# Patient Record
Sex: Female | Born: 1955 | Race: White | Hispanic: No | Marital: Married | Smoking: Former smoker
Health system: Southern US, Community
[De-identification: ages and names within clinical notes are randomized; demographics above are authoritative.]

## PROBLEM LIST (undated history)

## (undated) DIAGNOSIS — D250 Submucous leiomyoma of uterus: Secondary | ICD-10-CM

## (undated) DIAGNOSIS — L718 Other rosacea: Secondary | ICD-10-CM

## (undated) DIAGNOSIS — L409 Psoriasis, unspecified: Secondary | ICD-10-CM

## (undated) HISTORY — PX: ABDOMINAL HYSTERECTOMY: SHX81

## (undated) HISTORY — DX: Other rosacea: L71.8

## (undated) HISTORY — DX: Submucous leiomyoma of uterus: D25.0

## (undated) HISTORY — DX: Psoriasis, unspecified: L40.9

---

## 1999-05-21 ENCOUNTER — Other Ambulatory Visit: Admission: RE | Admit: 1999-05-21 | Discharge: 1999-05-21 | Payer: Self-pay | Admitting: Obstetrics and Gynecology

## 2000-11-04 ENCOUNTER — Other Ambulatory Visit: Admission: RE | Admit: 2000-11-04 | Discharge: 2000-11-04 | Payer: Self-pay | Admitting: Obstetrics and Gynecology

## 2003-01-17 ENCOUNTER — Other Ambulatory Visit: Admission: RE | Admit: 2003-01-17 | Discharge: 2003-01-17 | Payer: Self-pay | Admitting: Obstetrics and Gynecology

## 2004-03-15 ENCOUNTER — Other Ambulatory Visit: Admission: RE | Admit: 2004-03-15 | Discharge: 2004-03-15 | Payer: Self-pay | Admitting: Obstetrics and Gynecology

## 2004-05-15 ENCOUNTER — Ambulatory Visit (HOSPITAL_COMMUNITY): Admission: RE | Admit: 2004-05-15 | Discharge: 2004-05-15 | Payer: Self-pay | Admitting: Obstetrics and Gynecology

## 2004-05-15 ENCOUNTER — Encounter (INDEPENDENT_AMBULATORY_CARE_PROVIDER_SITE_OTHER): Payer: Self-pay | Admitting: *Deleted

## 2006-11-17 ENCOUNTER — Ambulatory Visit: Payer: Self-pay | Admitting: Family Medicine

## 2007-03-05 ENCOUNTER — Ambulatory Visit: Payer: Self-pay | Admitting: Family Medicine

## 2008-07-07 ENCOUNTER — Ambulatory Visit (HOSPITAL_COMMUNITY): Admission: RE | Admit: 2008-07-07 | Discharge: 2008-07-07 | Payer: Self-pay | Admitting: Obstetrics and Gynecology

## 2008-07-07 ENCOUNTER — Encounter (INDEPENDENT_AMBULATORY_CARE_PROVIDER_SITE_OTHER): Payer: Self-pay | Admitting: Obstetrics and Gynecology

## 2008-12-21 ENCOUNTER — Encounter (INDEPENDENT_AMBULATORY_CARE_PROVIDER_SITE_OTHER): Payer: Self-pay | Admitting: Obstetrics and Gynecology

## 2008-12-21 ENCOUNTER — Ambulatory Visit (HOSPITAL_COMMUNITY): Admission: RE | Admit: 2008-12-21 | Discharge: 2008-12-22 | Payer: Self-pay | Admitting: Obstetrics and Gynecology

## 2010-05-30 LAB — DIFFERENTIAL
Basophils Absolute: 0.1 10*3/uL (ref 0.0–0.1)
Basophils Relative: 1 % (ref 0–1)
Lymphocytes Relative: 24 % (ref 12–46)
Monocytes Relative: 7 % (ref 3–12)
Neutro Abs: 4.5 10*3/uL (ref 1.7–7.7)
Neutrophils Relative %: 66 % (ref 43–77)

## 2010-05-30 LAB — CBC
MCHC: 34.3 g/dL (ref 30.0–36.0)
MCV: 85.7 fL (ref 78.0–100.0)
RBC: 3.15 MIL/uL — ABNORMAL LOW (ref 3.87–5.11)
RBC: 3.73 MIL/uL — ABNORMAL LOW (ref 3.87–5.11)
WBC: 10.8 10*3/uL — ABNORMAL HIGH (ref 4.0–10.5)

## 2010-06-04 LAB — CBC
HCT: 32.6 % — ABNORMAL LOW (ref 36.0–46.0)
MCV: 85.8 fL (ref 78.0–100.0)
RBC: 3.8 MIL/uL — ABNORMAL LOW (ref 3.87–5.11)
WBC: 8 10*3/uL (ref 4.0–10.5)

## 2010-06-04 LAB — DIFFERENTIAL
Eosinophils Absolute: 0.1 10*3/uL (ref 0.0–0.7)
Eosinophils Relative: 1 % (ref 0–5)
Lymphs Abs: 1.2 10*3/uL (ref 0.7–4.0)
Monocytes Absolute: 0.5 10*3/uL (ref 0.1–1.0)
Monocytes Relative: 7 % (ref 3–12)

## 2010-07-09 NOTE — Op Note (Signed)
Andrea Hopkins, Andrea Hopkins            ACCOUNT NO.:  [id number]   MEDICAL RECORD NO.:  [id number]          PATIENT TYPE:  AMB   LOCATION:  SDC                           FACILITY:  [facility]   PHYSICIAN:  Juluis Mire, M.D.   DATE OF BIRTH:  29-Aug-1955   DATE OF PROCEDURE:  07/07/2008  DATE OF DISCHARGE:  07/07/2008                               OPERATIVE REPORT   PREOPERATIVE DIAGNOSES:  1. Abnormal uterine bleeding.  2. Uterine fibroids.   POSTOPERATIVE DIAGNOSIS:  1. Abnormal uterine bleeding.  2. Uterine fibroids.   PROCEDURE:  Dilatation and curettage.   SURGEON:  Juluis Mire, MD   ANESTHESIA:  General.   ESTIMATED BLOOD LOSS:  Minimal.   PACKS AND DRAINS:  None.   INTRAOPERATIVE BLOOD PLACED:  None.   COMPLICATIONS:  None.   INDICATIONS:  Dictated in history and physical.   PROCEDURE IN DETAIL:  The patient was taken to the OR and placed in  supine position.  After satisfactory level of general endotracheal  anesthesia was obtained, the patient was placed in the dorsal lithotomy  using the Allen stirrups.  Perineum and vagina prepped out with Betadine  and draped sterile field.  A speculum was placed in the vaginal vault.  A large amount of tissue was noted at the cervical opening, this was  removed.  Cervix grasped with a single-tooth tenaculum.  Uterus sounded  approximately to 12 cm, it was enlarged approximately 12 weeks.  We did  curettings and a moderate amount of tissue was obtained and sent for  pathology.  We continued endometrial curettings until minimal tissue was  being obtained.  We explored with the Randall stone forceps, re-  curetted, and we felt that we had completely cleared the intrauterine  cavity.  Bleeding was minimal at this point in time.  At this point  in time, the speculum was then removed along with single-tooth  tenaculum.  The patient was taken out in the dorsal lithotomy position,  once alert and extubated, transferred to recovery room  in good  condition.  Sponge, instrument, and needle count reported as correct by  circulating nurse.      Juluis Mire, M.D.  Electronically Signed     JSM/MEDQ  D:  07/07/2008  T:  07/08/2008  Job:  [id number]

## 2010-07-12 NOTE — Op Note (Signed)
Andrea Hopkins, Andrea Hopkins            ACCOUNT NO.:  [id number]   MEDICAL RECORD NO.:  [id number]          PATIENT TYPE:  AMB   LOCATION:  SDC                           [facility]   PHYSICIAN:  Juluis Mire, M.D.   DATE OF BIRTH:  11-21-1955   DATE OF PROCEDURE:  05/15/2004  DATE OF DISCHARGE:                                 OPERATIVE REPORT   PREOPERATIVE DIAGNOSIS:  Menorrhagia, with endometrial polyps.   PREOPERATIVE DIAGNOSIS:  Menorrhagia, with endometrial polyps.   PROCEDURE:  Paracervical block, cervical dilatation, hysteroscopy, resection  of polyps as well as endometrium, endometrial curettings.   SURGEON:  Juluis Mire, M.D.   ANESTHESIA:  Sedation with paracervical block.   ESTIMATED BLOOD LOSS:  Minimal.   PACKS AND DRAINS:  None.   INTRAOPERATIVE BLOOD REPLACED:  None.   COMPLICATIONS:  None.   INDICATIONS:  Are dictated in the history and physical.   PROCEDURE:  The patient was taken to the operating room and placed in the  supine position.  After light sedation, she was placed in the dorsal  lithotomy position using the Allen stirrups.  The patient was draped into a  sterile field.  Speculum was placed in the vaginal vault.  The cervix and  vagina were cleansed with Betadine.  Paracervical block was done using 1%  Nesacaine.  The cervix was grasped with a single-tooth tenaculum.  Uterus  was posteriorly oriented and sounded to 11 cm.  The cervix was serially  dilated to a size 35 Pratt dilator.  Operative hysteroscope was introduced,  and the intrauterine cavity was distended using sorbitol.  Visualization  revealed multiple thickened endometrium with irregularities but no distinct  polyp.  We therefore resected multiple areas of the endometrium and sent it  for pathological review.  At the end of the procedure, all irregularities of  the endometrium had been resected.  We did endometrial curettings.  Total  deficit was 90 cc.  She had no active bleeding  or signs of perforation.  Hysteroscope single-tooth tenaculum, and speculum were.  The patient was  taken out of the dorsal lithotomy position, and once alert was transferred  to the recovery room in good condition.  Sponge, instrument, and needle  counts were reported as correct by the circulating nurse.      JSM/MEDQ  D:  05/15/2004  T:  05/15/2004  Job:  [id number]

## 2010-07-12 NOTE — H&P (Signed)
NAME:  Andrea Hopkins, VENTURI NO.:  [id number]   MEDICAL RECORD NO.:  [id number]          PATIENT TYPE:  AMB   LOCATION:  SDC                           [facility]   PHYSICIAN:  Juluis Mire, M.D.   DATE OF BIRTH:  [birth year]   DATE OF ADMISSION:  DATE OF DISCHARGE:                                HISTORY & PHYSICAL   A 55 year old gravida 3, para 2-1-2-1, married white female who presents for  hysteroscopic evaluation.  Husband has had a prior vasectomy.   Cycles are regular.  She has six days of flow, one to two days being heavy,  changing pads frequently with clots.  No significant dysmenorrhea.  Saline  infusion ultrasound did reveal several decent-sized uterine polyps.  She  also had multiple small fibroids.  The ovaries are unremarkable.  In view of  the polyps, the patient now presents for hysteroscopic resection for  management of menorrhagia.   In terms of allergies, no known drug allergies.   MEDICATIONS:  None.   PAST MEDICAL HISTORY:  The usual childhood diseases.  No significant  sequelae.  No previous surgical history.   PAST OBSTETRICAL HISTORY:  She had preterm delivery of twins at 26 weeks and  subsequent vaginal delivery of a term infant.   SOCIAL HISTORY:  No tobacco or alcohol use.   REVIEW OF SYSTEMS:  Noncontributory.   FAMILY HISTORY:  Noncontributory.   PHYSICAL EXAMINATION:  VITAL SIGNS:  The patient is afebrile with stable  vital signs.  HEENT:  Patient normocephalic.  Pupils equal, round, and reactive to light  and accommodation.  Extraocular movements were intact.  Sclerae and  conjunctivae clear, oropharynx clear.  NECK:  Without thyromegaly.  BREASTS:  No discrete masses.  CHEST:  Lungs clear.  CARDIAC:  Regular rhythm and rate without murmurs or gallops.  ABDOMEN:  Benign.  There are no masses or organomegaly or tenderness.  PELVIC:  Normal external genitalia.  Vaginal mucosa clear.  Cervix is  unremarkable.  Uterus upper  limits of normal size, irregular.  Adnexa  unremarkable.  EXTREMITIES:  Trace edema.  NEUROLOGIC:  Grossly within normal limits.   IMPRESSION:  1.  Menorrhagia.  2.  Endometrial polyps.  3.  Uterine fibroids.   PLAN:  The patient will undergo a hysteroscopic evaluation and resection of  polyps for evaluation.  Hopefully, this will help her menstrual flow.  The  risks of surgery have been discussed, including the risk of infection; the  risk of vascular injury that could lead to hemorrhage requiring transfusion  with associated risk of AIDS or hepatitis; continued bleeding could require  hysterectomy.  There is a risk of perforation that could lead to injury to  adjacent organs requiring further exploratory surgery.  The risks of deep  venous thrombosis and pulmonary embolus.  There is also a risk of fluid  overload leading to hyponatremia and possible pulmonary edema.  The patient  expressed an understanding of indications and risks.      JSM/MEDQ  D:  05/15/2004  T:  05/15/2004  Job:  [id number]

## 2010-07-12 NOTE — H&P (Signed)
Andrea Hopkins, Andrea Hopkins            ACCOUNT NO.:  [id number]   MEDICAL RECORD NO.:  [id number]          PATIENT TYPE:  AMB   LOCATION:  SDC                           [facility]   PHYSICIAN:  Juluis Mire, M.D.   DATE OF BIRTH:  [birth year]   DATE OF ADMISSION:  07/07/2008  DATE OF DISCHARGE:  07/07/2008                              HISTORY & PHYSICAL   HISTORY OF PRESENT ILLNESS:  The patient 55 year old gravida 3, para 2,  abortus 1 female known uterine fibroids.  She had been having trouble  with continuous bleeding.  We had been trying to control with birth  control pills and Prometrium, had been unsuccessful.  She continues have  heavy bleeding with decrease in hemoglobins.  In view of this, we have  done ultrasound revealed multiple uterine fibroids.  She is going to go  through a D&C to see if we can reduce her menstrual flow at this point  in time.   ALLERGIES:  In terms of allergies.  No known drug allergies.   MEDICATIONS:  Include,  1. Birth control pills.  2. Prometrium.  3. Iron sulfate supplementation.   PAST MEDICAL HISTORY:  Usual childhood diseases.  No significant  sequelae.   PAST SURGERIES:  She had a previous hysteroscopy.  Otherwise, she has  had obstetrical deliveries.   SOCIAL HISTORY:  No tobacco or alcohol use.   FAMILY HISTORY:  Noncontributory.   REVIEW OF SYSTEMS:  Noncontributory.   PHYSICAL EXAMINATION:  VITAL SIGNS:  The the patient is afebrile, stable  vital signs.  HEENT:  The patient is normocephalic.  Pupils equal, round and reactive  to light and accommodation.  Extraocular movements were intact.  Sclerae  and conjunctivae are clear.  Oropharynx clear.  NECK:  Without thyromegaly.  BREASTS:  Not examined.  LUNGS:  Clear.  CARDIOVASCULAR SYSTEM:  Regular rate.  No murmurs or gallops.  ABDOMEN:  Benign.  PELVIC:  Normal external genitalia.  Vaginal mucosa is clear.  Cervix  unremarkable.  Uterus 12 weeks in size.  Adnexa  unremarkable.  EXTREMITIES:  Trace edema.  NEUROLOGIC:  Grossly normal limits.   IMPRESSION:  Menorrhagia in a perimenopausal patient with known uterine  fibroids.   PLAN:  We are going to do D and C to see if can slow down the bleeding.  Hopefully the birth control pills after that will have a chance to work.  The risk of procedure have been discussed including the risk of  infection.  Risk of hemorrhage could require transfusion with risk of  AIDS or hepatitis.  Excessive bleeding could require  hysterectomy.  There is risk of perforation that could lead to injury to  adjacent organs, requiring exploratory surgery, risk of deep venous  thrombosis, and pulmonary embolus.  The patient express understanding of  the indications and risks.      Juluis Mire, M.D.  Electronically Signed     JSM/MEDQ  D:  07/07/2008  T:  07/08/2008  Job:  [id number]

## 2012-11-07 ENCOUNTER — Emergency Department (HOSPITAL_COMMUNITY)
Admission: EM | Admit: 2012-11-07 | Discharge: 2012-11-07 | Disposition: A | Discharge status: Home / Self Care | Payer: BC Managed Care – PPO | Attending: Emergency Medicine | Admitting: Emergency Medicine

## 2012-11-07 ENCOUNTER — Encounter (HOSPITAL_COMMUNITY): Payer: Self-pay | Admitting: Emergency Medicine

## 2012-11-07 ENCOUNTER — Emergency Department (HOSPITAL_COMMUNITY): Payer: BC Managed Care – PPO

## 2012-11-07 DIAGNOSIS — M549 Dorsalgia, unspecified: Secondary | ICD-10-CM | POA: Insufficient documentation

## 2012-11-07 DIAGNOSIS — Z87891 Personal history of nicotine dependence: Secondary | ICD-10-CM | POA: Insufficient documentation

## 2012-11-07 DIAGNOSIS — R079 Chest pain, unspecified: Secondary | ICD-10-CM | POA: Insufficient documentation

## 2012-11-07 LAB — BASIC METABOLIC PANEL
BUN: 16 mg/dL (ref 6–23)
Chloride: 100 mEq/L (ref 96–112)
Creatinine, Ser: 0.61 mg/dL (ref 0.50–1.10)
GFR calc Af Amer: >90 mL/min (ref >90)
GFR calc non Af Amer: >90 mL/min (ref >90)

## 2012-11-07 LAB — CBC
HCT: 39.8 % (ref 36.0–46.0)
MCHC: 33.2 g/dL (ref 30.0–36.0)
MCV: 85.2 fL (ref 78.0–100.0)
RDW: 13.4 % (ref 11.5–15.5)

## 2012-11-07 LAB — POCT I-STAT TROPONIN I

## 2012-11-07 MED ORDER — ASPIRIN 81 MG PO CHEW
324.0000 mg | CHEWABLE_TABLET | Freq: Once | ORAL | Status: AC
  Administered 2012-11-07: 324 mg via ORAL
  Filled 2012-11-07: qty 4

## 2012-11-07 NOTE — ED Notes (Signed)
Only complaining of mid back pain.

## 2012-11-07 NOTE — ED Provider Notes (Signed)
CSN: [id number]     Arrival date & time 11/07/12  0410 History   First MD Initiated Contact with Patient 11/07/12 [phone]     Chief Complaint  Patient presents with  . Chest Pain  . Back Pain   (Consider location/radiation/quality/duration/timing/severity/associated sxs/prior Treatment) HPI Comments: 57 yo wf with cp and back pain.  Onset at 0130 AM.  It awoke pt from sleep. No previous similar pain.  No f/c, recent illnesses.  No meds.  No allergies.  No previous cardiac issues.    Patient is a 57 y.o. female presenting with chest pain and back pain. The history is provided by the patient and the spouse.  Chest Pain Pain location:  Substernal area Pain quality: aching   Pain radiates to the back: yes   Pain severity:  Moderate Duration:  4 hours Timing:  Constant Progression:  Improving Chronicity:  New Context: at rest and trauma   Context: not breathing, no drug use, not eating, no intercourse, not lifting, no movement, not raising an arm and no stress   Relieved by:  Nothing Worsened by:  Nothing tried Associated symptoms: back pain   Associated symptoms: no AICD problem, no altered mental status, no anxiety, no claudication, no cough, no diaphoresis, no dizziness, no dysphagia, no fatigue, no fever, no headache, no heartburn, no lower extremity edema, no nausea, no near-syncope, no palpitations and no shortness of breath   Risk factors: no aortic disease, no birth control, no coronary artery disease, no diabetes mellitus, no Ehlers-Danlos syndrome, no high cholesterol and no hypertension   Back Pain Associated symptoms: chest pain   Associated symptoms: no fever and no headaches     History reviewed. No pertinent past medical history. Past Surgical History  Procedure Laterality Date  . Abdominal hysterectomy     History reviewed. No pertinent family history. History  Substance Use Topics  . Smoking status: Former Games developer  . Smokeless tobacco: Not on file  . Alcohol Use:  Yes   OB History   Grav Para Term Preterm Abortions TAB SAB Ect Mult Living                 Review of Systems  Constitutional: Negative.  Negative for fever, diaphoresis and fatigue.  HENT: Negative.  Negative for trouble swallowing.   Eyes: Negative.   Respiratory: Negative.  Negative for apnea, cough, choking, chest tightness, shortness of breath, wheezing and stridor.   Cardiovascular: Positive for chest pain. Negative for palpitations, claudication, leg swelling and near-syncope.  Gastrointestinal: Negative.  Negative for heartburn and nausea.  Endocrine: Negative.   Genitourinary: Negative.  Negative for difficulty urinating and menstrual problem.  Musculoskeletal: Positive for back pain.  Neurological: Negative for dizziness and headaches.  Hematological: Negative.     Allergies  Review of patient's allergies indicates no known allergies.  Home Medications  No current outpatient prescriptions on file. BP 130/62  Pulse 61  Temp(Src) 98.1 F (36.7 C) (Oral)  Resp 18  SpO2 96% Physical Exam  Constitutional: She is oriented to person, place, and time. She appears well-developed and well-nourished. No distress.  HENT:  Head: Normocephalic and atraumatic.  Eyes: Conjunctivae are normal. Pupils are equal, round, and reactive to light.  Neck: Normal range of motion. Neck supple.  Cardiovascular: Normal rate and regular rhythm.  Exam reveals no gallop and no friction rub.   No murmur heard. Pulmonary/Chest: Effort normal and breath sounds normal.  Abdominal: Soft. Bowel sounds are normal. She exhibits no distension and  no mass. There is no tenderness. There is no rebound and no guarding.  Musculoskeletal: Normal range of motion.  Neurological: She is alert and oriented to person, place, and time. She has normal reflexes.  Skin: Skin is warm and dry. She is not diaphoretic.    ED Course  Procedures (including critical care time) Labs Review Labs Reviewed  BASIC  METABOLIC PANEL - Abnormal; Notable for the following:    Sodium 134 (*)    Potassium 3.3 (*)    Glucose, Bld 111 (*)    All other components within normal limits  CBC  POCT I-STAT TROPONIN I  POCT I-STAT TROPONIN I   Imaging Review Dg Chest 2 View  11/07/2012   *RADIOLOGY REPORT*  Clinical Data: Chest pain, back pain  CHEST - 2 VIEW  Comparison:  None available.  Findings: Cardiac and mediastinal silhouettes are within normal limits.  The lungs are well inflated.  No consolidation, pleural effusion, or pulmonary edema.  There is no pneumothorax.  No acute osseous abnormality.  IMPRESSION:  No acute cardiopulmonary process.   Original Report Authenticated By: Rise Mu, M.D.    Date: 11/07/2012 # 1  Rate: 63  Rhythm: normal sinus rhythm  QRS Axis: normal  Intervals: normal  ST/T Wave abnormalities: normal  Conduction Disutrbances:none  Narrative Interpretation:   Old EKG Reviewed: none available   Date: 11/07/2012 # 2 @ 0412  Rate: 63  Rhythm: normal sinus rhythm  QRS Axis: normal  Intervals: normal  ST/T Wave abnormalities: normal  Conduction Disutrbances:none  Narrative Interpretation:   Old EKG Reviewed: none available    Results for orders placed during the hospital encounter of 11/07/12  CBC      Result Value Range   WBC 9.7  4.0 - 10.5 K/uL   RBC 4.67  3.87 - 5.11 MIL/uL   Hemoglobin 13.2  12.0 - 15.0 g/dL   HCT 16.1  09.6 - 04.5 %   MCV 85.2  78.0 - 100.0 fL   MCH 28.3  26.0 - 34.0 pg   MCHC 33.2  30.0 - 36.0 g/dL   RDW 40.9  81.1 - 91.4 %   Platelets 220  150 - 400 K/uL  BASIC METABOLIC PANEL      Result Value Range   Sodium 134 (*) 135 - 145 mEq/L   Potassium 3.3 (*) 3.5 - 5.1 mEq/L   Chloride 100  96 - 112 mEq/L   CO2 23  19 - 32 mEq/L   Glucose, Bld 111 (*) 70 - 99 mg/dL   BUN 16  6 - 23 mg/dL   Creatinine, Ser 7.82  0.50 - 1.10 mg/dL   Calcium 8.9  8.4 - 95.6 mg/dL   GFR calc non Af Amer >90  >90 mL/min   GFR calc Af Amer >90  >90 mL/min   POCT I-STAT TROPONIN I      Result Value Range   Troponin i, poc 0.00  0.00 - 0.08 ng/mL   Comment 3           POCT I-STAT TROPONIN I      Result Value Range   Troponin i, poc 0.00  0.00 - 0.08 ng/mL   Comment 3              MDM   1. Chest pain    57 year old female presents emergency department with chief complaint of chest pain and back pain.  Patient is otherwise healthy and has no medical problems and takes no  medications. Her evaluation in the emergency department was negative. EKG x2, chest x-ray, troponin x2 negative for acute disease. I discussed obtaining a chest CT to assess for aortic dissection, but I feel this is low likelihood. Patient and her husband opted not to have a CT performed. At this point I feel the patient is stable for discharge. I do not suspect acute coronary syndrome, PE, dissection, pneumothorax, or other emergent etiology of her chest pain. Strict ER return precautions were given. Patient will followup with her primary care provider.  The patient appears reasonably screened and/or stabilized for discharge and I doubt any other medical condition or other [facility] requiring further screening, evaluation, or treatment in the ED at this time prior to discharge.  7:00 AM VSS, pt symptom free, requesting to go home    Darlys Gales, MD 11/07/12 0700

## 2012-11-07 NOTE — ED Notes (Signed)
Believes her cp was r/t eating spicy foods yesterday at [employer].

## 2012-11-07 NOTE — ED Notes (Signed)
Pt. woke up from her sleep this morning with mid chest pressure/tightness and mid back pain , denies SOB or nausea. Slight diaphoresis.

## 2014-04-25 ENCOUNTER — Other Ambulatory Visit: Payer: Self-pay | Admitting: Obstetrics and Gynecology

## 2014-04-27 LAB — CYTOLOGY - PAP

## 2014-04-27 LAB — HM PAP SMEAR: HM Pap smear: NEGATIVE

## 2015-05-04 ENCOUNTER — Other Ambulatory Visit: Payer: Self-pay | Admitting: Obstetrics and Gynecology

## 2015-05-04 DIAGNOSIS — R928 Other abnormal and inconclusive findings on diagnostic imaging of breast: Secondary | ICD-10-CM

## 2015-05-21 ENCOUNTER — Other Ambulatory Visit: Payer: Self-pay | Admitting: Obstetrics and Gynecology

## 2015-05-21 ENCOUNTER — Ambulatory Visit
Admission: RE | Admit: 2015-05-21 | Discharge: 2015-05-21 | Disposition: A | Discharge status: Home / Self Care | Payer: BLUE CROSS/BLUE SHIELD | Source: Ambulatory Visit | Attending: Obstetrics and Gynecology | Admitting: Obstetrics and Gynecology

## 2015-05-21 DIAGNOSIS — R928 Other abnormal and inconclusive findings on diagnostic imaging of breast: Secondary | ICD-10-CM

## 2015-06-06 ENCOUNTER — Telehealth: Payer: Self-pay | Admitting: Family Medicine

## 2015-06-06 NOTE — Telephone Encounter (Signed)
Left message for pt to call. Ok per [employer] to return as a pt.

## 2015-06-11 ENCOUNTER — Encounter: Payer: Self-pay | Admitting: Family Medicine

## 2015-06-11 ENCOUNTER — Ambulatory Visit (INDEPENDENT_AMBULATORY_CARE_PROVIDER_SITE_OTHER): Payer: BLUE CROSS/BLUE SHIELD | Admitting: Family Medicine

## 2015-06-11 VITALS — BP 126/82 | HR 92 | Ht 63.0 in | Wt 193.0 lb

## 2015-06-11 DIAGNOSIS — L409 Psoriasis, unspecified: Secondary | ICD-10-CM | POA: Diagnosis not present

## 2015-06-11 DIAGNOSIS — M25561 Pain in right knee: Secondary | ICD-10-CM

## 2015-06-11 DIAGNOSIS — Z803 Family history of malignant neoplasm of breast: Secondary | ICD-10-CM | POA: Diagnosis not present

## 2015-06-11 NOTE — Progress Notes (Signed)
   Subjective:    Patient ID: Andrea Hopkins, female    DOB: March 29, 1955, 60 y.o.   MRN: [id number]  HPI She is here for consult concerning right knee arthritis. She has had difficulty with this for the last 6 months. No history of injury, popping, locking or grinding. She does have a history of psoriasis and is using a topical preparation. She has concerns over the possibility of having psoriatic arthritis. She does not complain of pain anywhere else other than occasional left knee discomfort.   Review of Systems     Objective:   Physical Exam Exam of her skin on her legs does show a few psoriatic lesions that are slightly erythematous and dry. 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 Exam of her knee shows no effusion. Anterior drawer and McMurray's testing negative. Medial and lateral collateral ligaments intact.       Assessment & Plan:  Right knee pain - Plan: DG Knee Complete 4 Views Right  Psoriasis discussed the knee pain with her in regard to generalized arthritis versus psoriatic arthritis. Explained that since she is not having other joints involved, psoriatic arthritis is a little bit less of a concern. Follow-up after x-rays are back. Also discussed general health care. She usually sees her gynecologist and most of her health care needs have been taken care of by him. She does have a sister with breast cancer and therefore gets yearly mammogram.

## 2015-06-11 NOTE — Patient Instructions (Signed)
Tylenol first and then if that doesn't work either Advil or Aleve for pain relief. You can take 2 Aleve twice per day for 4  Advil 3 times per day.

## 2015-06-14 ENCOUNTER — Ambulatory Visit
Admission: RE | Admit: 2015-06-14 | Discharge: 2015-06-14 | Disposition: A | Discharge status: Home / Self Care | Payer: BLUE CROSS/BLUE SHIELD | Source: Ambulatory Visit | Attending: Family Medicine | Admitting: Family Medicine

## 2015-06-14 ENCOUNTER — Other Ambulatory Visit: Payer: Self-pay | Admitting: *Deleted

## 2015-06-14 DIAGNOSIS — M25561 Pain in right knee: Secondary | ICD-10-CM

## 2015-06-26 ENCOUNTER — Ambulatory Visit (INDEPENDENT_AMBULATORY_CARE_PROVIDER_SITE_OTHER): Payer: BLUE CROSS/BLUE SHIELD | Admitting: Family Medicine

## 2015-06-26 DIAGNOSIS — M25561 Pain in right knee: Secondary | ICD-10-CM

## 2015-06-26 MED ORDER — TRIAMCINOLONE ACETONIDE 40 MG/ML IJ SUSP
40.0000 mg | Freq: Once | INTRAMUSCULAR | Status: AC
  Administered 2015-06-26: 40 mg via INTRAMUSCULAR

## 2015-06-26 MED ORDER — LIDOCAINE HCL (PF) 1 % IJ SOLN
2.0000 mL | Freq: Once | INTRAMUSCULAR | Status: AC
  Administered 2015-06-26: 2 mL via INTRADERMAL

## 2015-06-26 NOTE — Progress Notes (Signed)
   Subjective:    Patient ID: Andrea Hopkins, female    DOB: [birth year], 60 y.o.   MRN: [id number]  HPI She is here for recheck. Recent x-ray did show minimal degenerative changes and she is here for follow-up visit concerning that.   Review of Systems     Objective:   Physical Exam Alert and in no distress. The knee was not examined.       Assessment & Plan:  Right knee pain - Plan: lidocaine (PF) (XYLOCAINE) 1 % injection 2 mL, triamcinolone acetonide (KENALOG-40) injection 40 mg The lateral aspect of the knee was cleaned with Betadine. The joint line was identified. 40 mg of Kenalog and 3 mL of Xylocaine was injected into the joint space without difficulty. Within several minutes she did obtain relief of her knee pain. She will call if she has a reoccurrence of her discomfort.

## 2016-05-01 LAB — HM DEXA SCAN: HM Dexa Scan: NORMAL

## 2016-09-02 ENCOUNTER — Ambulatory Visit (INDEPENDENT_AMBULATORY_CARE_PROVIDER_SITE_OTHER): Payer: BLUE CROSS/BLUE SHIELD | Admitting: Family Medicine

## 2016-09-02 ENCOUNTER — Encounter: Payer: Self-pay | Admitting: Family Medicine

## 2016-09-02 VITALS — BP 110/70 | HR 90 | Wt 196.0 lb

## 2016-09-02 DIAGNOSIS — M25562 Pain in left knee: Secondary | ICD-10-CM | POA: Diagnosis not present

## 2016-09-02 DIAGNOSIS — G8929 Other chronic pain: Secondary | ICD-10-CM

## 2016-09-02 DIAGNOSIS — M25561 Pain in right knee: Secondary | ICD-10-CM

## 2016-09-02 DIAGNOSIS — L409 Psoriasis, unspecified: Secondary | ICD-10-CM | POA: Diagnosis not present

## 2016-09-02 DIAGNOSIS — E669 Obesity, unspecified: Secondary | ICD-10-CM | POA: Diagnosis not present

## 2016-09-02 MED ORDER — TRIAMCINOLONE ACETONIDE 40 MG/ML IJ SUSP
80.0000 mg | Freq: Once | INTRAMUSCULAR | Status: AC
  Administered 2016-09-02: 80 mg via INTRAMUSCULAR

## 2016-09-02 MED ORDER — LIDOCAINE HCL 2 % IJ SOLN
6.0000 mL | Freq: Once | INTRAMUSCULAR | Status: AC
  Administered 2016-09-02: 120 mg via INTRADERMAL

## 2016-09-02 NOTE — Progress Notes (Signed)
   Subjective:    Patient ID: Andrea Hopkins, female    DOB: 09-Jun-1955, 61 y.o.   MRN: [id number]  HPI She is here for evaluation of continued difficulty with knee pain. She did have trouble last year with right knee pain and x-ray showed some mild degenerative changes. She was given a steroid injection which did give her roughly 9 months worth of benefit. She is now having pain in both knees. She also has a history of psoriasis and is concerned about this being psoriatic arthritis. She has no other joints involved. No fever, chills, skin changes other than on her legs.   Review of Systems     Objective:   Physical Exam Alert and in no distress. Exam of both knees shows no effusion. No palpable tenderness. Negative anterior drawer. McMurray's testing negative.       Assessment & Plan:  Chronic pain of both knees  Psoriasis Discussed the diagnosis of psoriasis and possible psoriatic arthritis. Explained that we would need to pursue that further when she is ready but at this point do not feel that is truly the case. Discussed injections and she would definitely like to have them. Both knees were prepped with Betadine laterally. The joint space was identified. 40 mg of Kenalog and 3 mL of Xylocaine was injected into both joints without difficulty. She did obtain relative quick relief of most of her pain. She will keep me informed as to how she is doing. I then discussed diet and exercise and the benefits on her knee pain with her. Encouraged her to increase her physical activity and also work on decreasing carbohydrates. Mention the [employer] and [employer].

## 2016-09-02 NOTE — Addendum Note (Signed)
Addended by: Randel Books on: 09/02/2016 04:52 PM   Modules accepted: Orders

## 2018-02-18 IMAGING — MG MM DIAGNOSTIC UNILATERAL R
3 series · 3 of 3 positions shown · non-contrast
Comparison: 04/30/2015 and earlier

CLINICAL DATA: Patient returns after screening study for evaluation
of right breast calcifications.

EXAM:
DIGITAL DIAGNOSTIC RIGHT MAMMOGRAM WITH CAD

[R ML (1 of 2)]
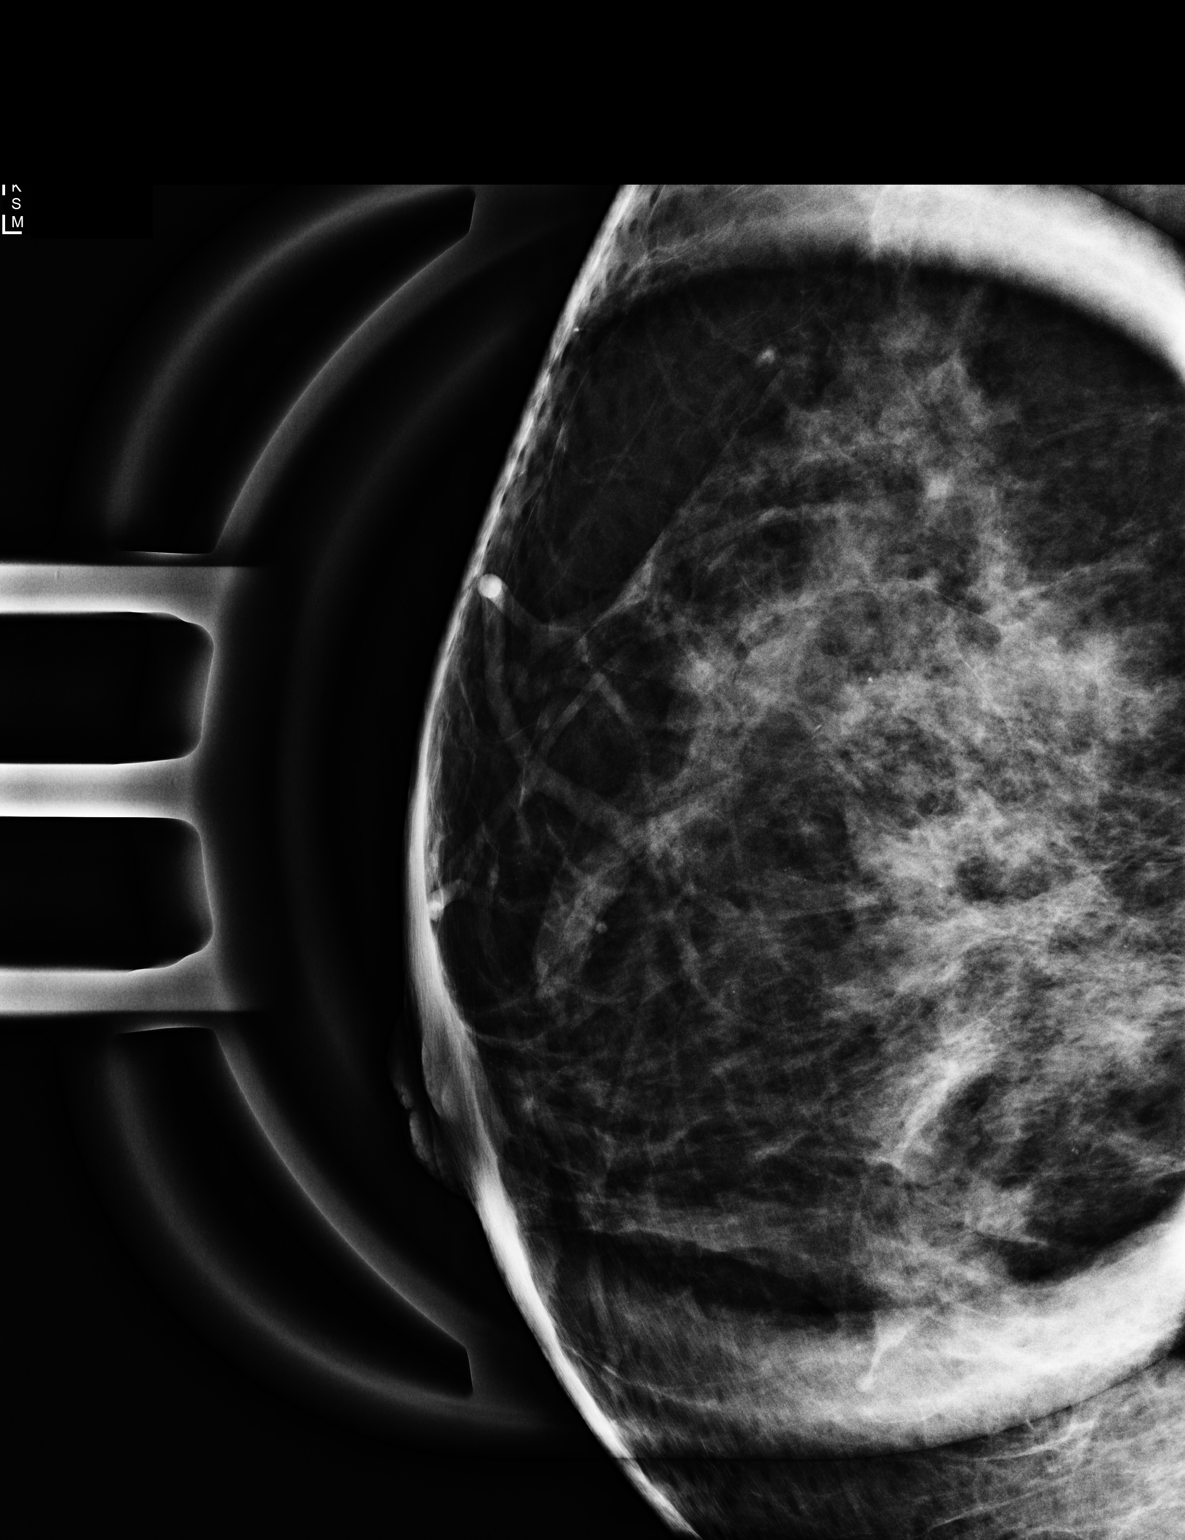

[R CC]
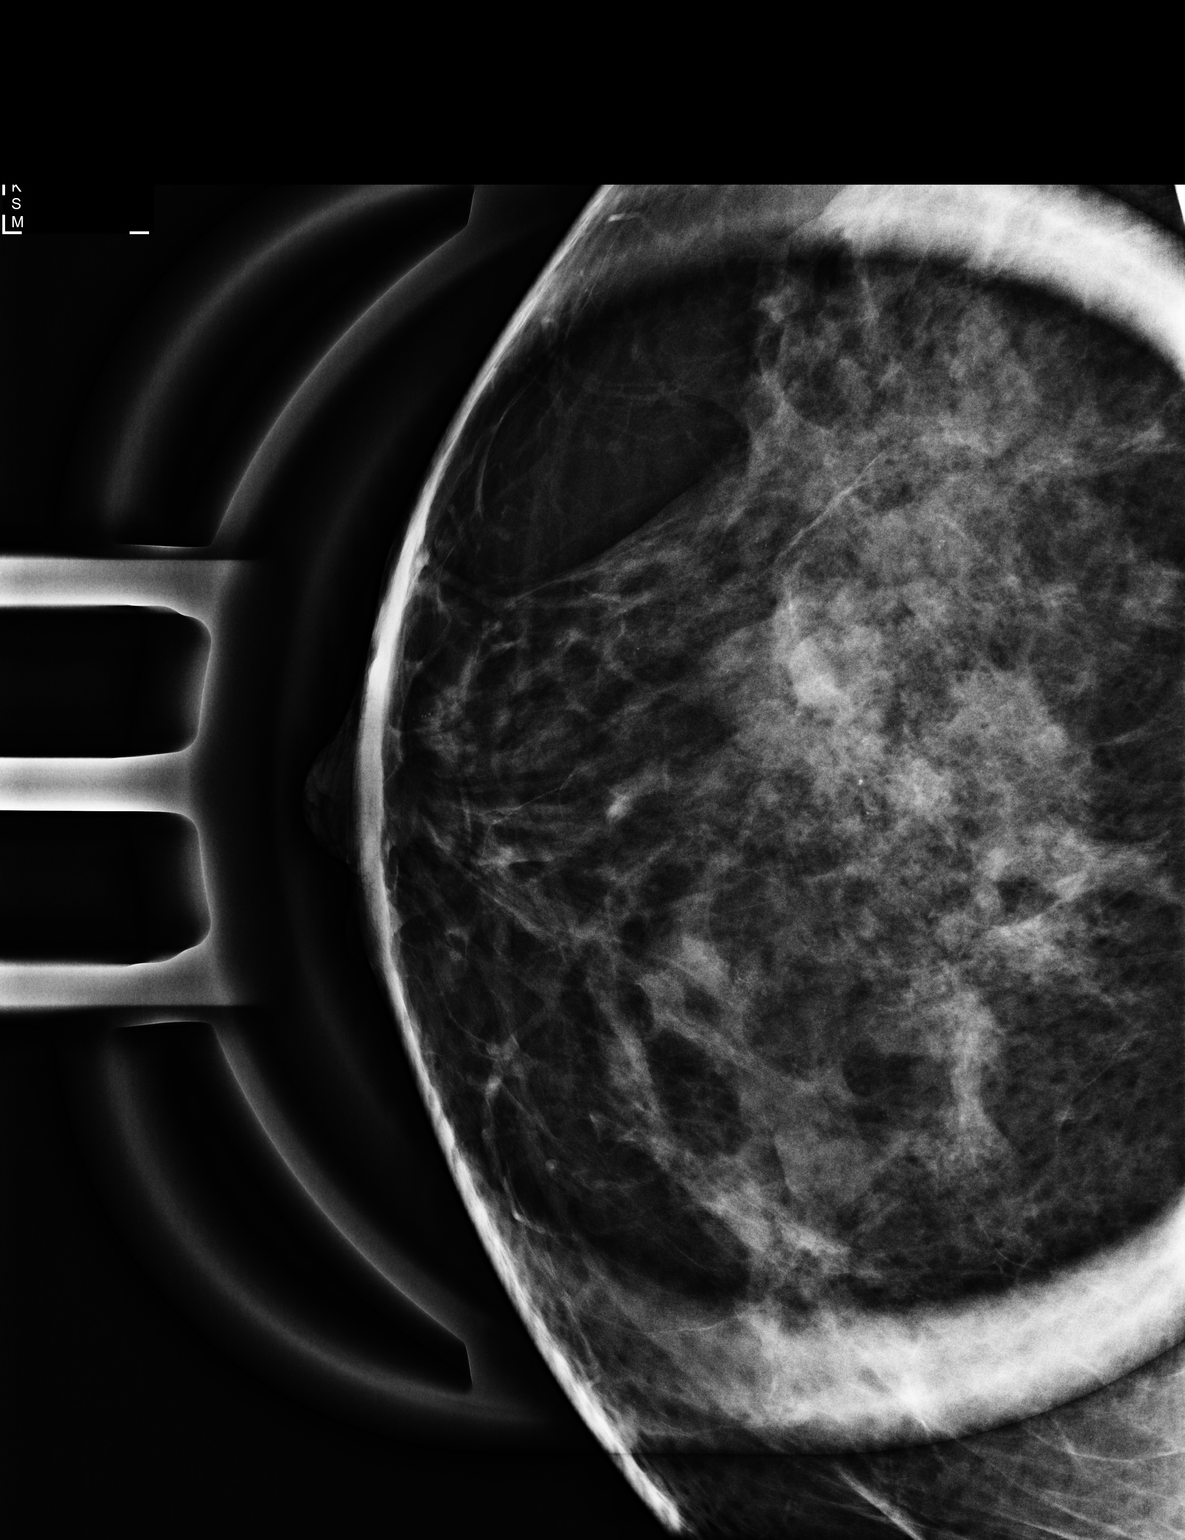

[R ML (2 of 2)]
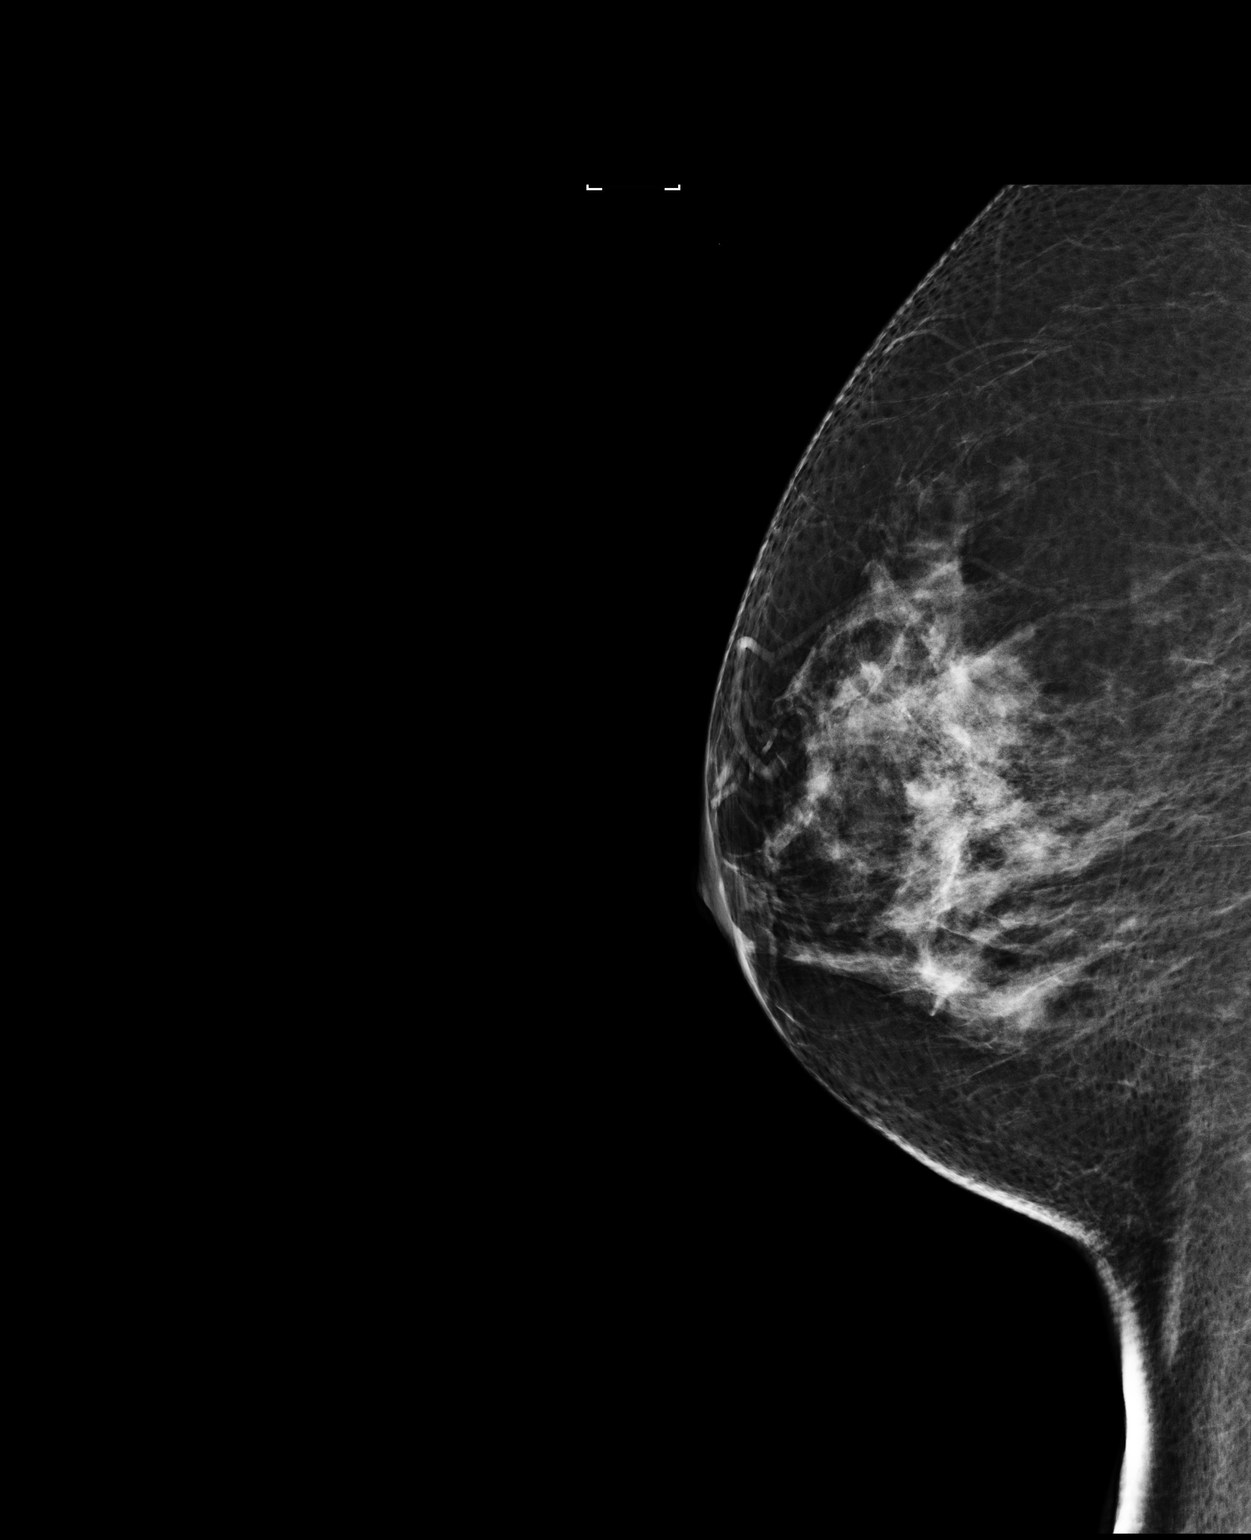

[3 of 3 positions shown; findings below may reference images not displayed]

ACR Breast Density Category c: The breast tissue is heterogeneously
dense, which may obscure small masses.
FINDINGS: Magnified views are performed of calcifications in the right breast,
demonstrating a small group of calcifications, primarily rounded and
layering on true lateral views. Findings are consistent benign milk
of calcium. No suspicious morphology or distribution of
calcifications identified.

Mammographic images were processed with CAD.
IMPRESSION: Benign right breast calcifications.

RECOMMENDATION:
Screening mammogram in one year.(Code:[id number])

I have discussed the findings and recommendations with the patient.
Results were also provided in writing at the conclusion of the
visit. If applicable, a reminder letter will be sent to the patient
regarding the next appointment.

BI-RADS CATEGORY  2: Benign.

## 2018-07-22 ENCOUNTER — Other Ambulatory Visit: Payer: Self-pay | Admitting: Obstetrics and Gynecology

## 2018-07-22 DIAGNOSIS — Z803 Family history of malignant neoplasm of breast: Secondary | ICD-10-CM

## 2018-08-19 ENCOUNTER — Ambulatory Visit
Admission: RE | Admit: 2018-08-19 | Discharge: 2018-08-19 | Disposition: A | Discharge status: Home / Self Care | Payer: BC Managed Care – PPO | Source: Ambulatory Visit | Attending: Obstetrics and Gynecology | Admitting: Obstetrics and Gynecology

## 2018-08-19 DIAGNOSIS — Z803 Family history of malignant neoplasm of breast: Secondary | ICD-10-CM

## 2018-08-19 MED ORDER — GADOBUTROL 1 MMOL/ML IV SOLN
10.0000 mL | Freq: Once | INTRAVENOUS | Status: AC | PRN
  Administered 2018-08-19: 10 mL via INTRAVENOUS

## 2018-10-08 LAB — HM COLONOSCOPY

## 2018-12-16 ENCOUNTER — Other Ambulatory Visit: Payer: Self-pay

## 2018-12-31 ENCOUNTER — Telehealth: Payer: Self-pay | Admitting: Family Medicine

## 2018-12-31 NOTE — Telephone Encounter (Signed)
Received requested info from [facility]

## 2019-01-04 NOTE — Progress Notes (Signed)
ERROR

## 2019-01-19 ENCOUNTER — Encounter: Payer: Self-pay | Admitting: Family Medicine

## 2020-03-29 ENCOUNTER — Other Ambulatory Visit: Payer: Self-pay | Admitting: Obstetrics and Gynecology

## 2020-03-29 DIAGNOSIS — R928 Other abnormal and inconclusive findings on diagnostic imaging of breast: Secondary | ICD-10-CM

## 2020-04-12 ENCOUNTER — Telehealth: Payer: Self-pay

## 2020-04-12 NOTE — Telephone Encounter (Signed)
NOTES ON FILE FROM DR [facility] [phone], SENT REFERRAL TO SCHEDULING

## 2020-04-13 ENCOUNTER — Other Ambulatory Visit: Payer: Self-pay

## 2020-04-13 ENCOUNTER — Ambulatory Visit
Admission: RE | Admit: 2020-04-13 | Discharge: 2020-04-13 | Disposition: A | Discharge status: Home / Self Care | Payer: BC Managed Care – PPO | Source: Ambulatory Visit | Attending: Obstetrics and Gynecology | Admitting: Obstetrics and Gynecology

## 2020-04-13 DIAGNOSIS — R928 Other abnormal and inconclusive findings on diagnostic imaging of breast: Secondary | ICD-10-CM

## 2020-04-13 LAB — HM MAMMOGRAPHY

## 2020-04-24 ENCOUNTER — Encounter: Payer: Self-pay | Admitting: General Practice

## 2020-08-13 ENCOUNTER — Telehealth: Payer: Self-pay

## 2020-08-13 NOTE — Telephone Encounter (Signed)
Pt needs scheduling appt. Progress notes on file sent from: [facility] [place], phone [phone]. A copy of referral order was sent to scheduling.

## 2020-11-08 ENCOUNTER — Other Ambulatory Visit: Payer: Self-pay

## 2020-11-08 ENCOUNTER — Ambulatory Visit (INDEPENDENT_AMBULATORY_CARE_PROVIDER_SITE_OTHER): Payer: BC Managed Care – PPO | Admitting: Cardiology

## 2020-11-08 ENCOUNTER — Encounter: Payer: Self-pay | Admitting: Cardiology

## 2020-11-08 DIAGNOSIS — R7982 Elevated C-reactive protein (CRP): Secondary | ICD-10-CM | POA: Diagnosis not present

## 2020-11-08 NOTE — Patient Instructions (Signed)
Medication Instructions:  No change   Lab Work: None ordered   Testing/Procedures: None ordered   Follow-Up: At [facility], you and your health needs are our priority.  As part of our continuing mission to provide you with exceptional heart care, we have created designated Provider Care Teams.  These Care Teams include your primary Cardiologist (physician) and Advanced Practice Providers (APPs -  Physician Assistants and Nurse Practitioners) who all work together to provide you with the care you need, when you need it.  We recommend signing up for the patient portal called "MyChart".  Sign up information is provided on this After Visit Summary.  MyChart is used to connect with patients for Virtual Visits (Telemedicine).  Patients are able to view lab/test results, encounter notes, upcoming appointments, etc.  Non-urgent messages can be sent to your provider as well.   To learn more about what you can do with MyChart, go to [email or web address].       Your next appointment:   2 years         The format for your next appointment: Office   Provider:  Dr.Harding

## 2020-11-08 NOTE — Progress Notes (Signed)
Primary Care Provider: Denita Lung, MD Gynecologist: Perfecto Kingdom, MD ([facility]) Cardiologist: None Electrophysiologist: None  Clinic Note: Chief Complaint  Patient presents with   New Patient (Initial Visit)    Patient is not 100% sure but thinks that there was confusion that she may have a family history of premature CAD that is incorrect.   Abnormal Lab    Elevated CRP level (note not available at time of clinic visit.     ===================================  ASSESSMENT/PLAN   Problem List Items Addressed This Visit     Elevated C-reactive protein (CRP)    I guess this is the main reason why she was referred.  Unfortunately I was not aware of this when I saw her.  Her lipid panel looks pretty well controlled and she is not having any symptoms.  For risk stratification, recommendation would be coronary calcium score.  Based on this we can determine if additional therapy would be required.  She is already taken aspirin which may or may not have benefit.  She does not have hypertension at baseline, and lipids are relatively well controlled.  Glucose level was normal.  Her major risk factor would be obesity which could also lead to elevated CRP levels.  When she left, the plan was for her to return in 2 years for follow-up.  This was before getting the clinic note from Dr. Radene Knee about CRP level. -->  We will contact her to see if she would be interested in checking a Coronary Calcium Score for risk stratification.      ===================================  HPI:    Andrea Hopkins is a 65 y.o. female with a PMH below who is being seen today for the evaluation of ELEVATED C-REACTIVE PROTEIN (CRP -10.69) with at the request of Arvella Nigh, MD.  --> Unfortunately, at the time of the original visit, I did not have any paperwork from the referring physician.  Andrea Hopkins was referred by her gynecologist for abnormal CRP levels and  at the time what was considered family history of premature CAD. After seeing Dr. Ophelia Charter, Andrea Hopkins conferred with her mother about her father's history.  As it turns out, he never had a heart attack.  He had what was more little than likely and panic attack at age 52.  He was never diagnosed with a heart attack and never had any evaluation. -> I was unaware of the CRP level at the time of the evaluation.  Recent Hospitalizations: None  Reviewed  CV studies:    The following studies were reviewed today: (if available, images/films reviewed: From Epic Chart or Care Everywhere) None:  Interval History:   Andrea Hopkins presents here today for evaluation and she is not really sure.  She did not know anything about CRP level, she knew about her lipid panel and was not aware that it was all that bad.  She does recall the confusion about her father's history.  She wanted to clarify that he did not have a heart attack, and there is no family history of CAD.  She is moderately obese and not very active and therefore she has some exertional dyspnea from deconditioning.  She does not exercise very often.  She would like to, but "never gets around to".  She denies any chest pain or pressure with rest or exertion.  No resting dyspnea, PND, orthopnea or edema.  Pretty much stable from a cardiac standpoint.  CV Review of Symptoms (  Summary) Cardiovascular ROS: positive for - mild exertional dyspnea probably more related to deconditioning and obesity negative for - chest pain, edema, irregular heartbeat, orthopnea, palpitations, paroxysmal nocturnal dyspnea, rapid heart rate, shortness of breath, or lightheadedness or dizziness, syncope/near syncope or TIA/amaurosis fugax, claudication  REVIEWED OF SYSTEMS   Review of Systems  Constitutional:  Negative for malaise/fatigue and weight loss.  HENT:  Negative for congestion and nosebleeds.   Respiratory:  Negative for cough and shortness of breath.    Gastrointestinal:  Negative for blood in stool and melena.  Genitourinary:  Negative for hematuria.  Musculoskeletal:  Positive for joint pain (Joint started hurting her a little bit). Negative for myalgias.  Neurological:  Negative for dizziness, weakness and headaches.  Psychiatric/Behavioral:  Negative for depression. The patient is not nervous/anxious.    I have reviewed and (if needed) personally updated the patient's problem list, medications, allergies, past medical and surgical history, social and family history.   PAST MEDICAL HISTORY   Past Medical History:  Diagnosis Date   Ocular rosacea    Psoriasis    Uterine fibroids    s/p Lap-assisted vaginal hysterectomy    PAST SURGICAL HISTORY   Past Surgical History:  Procedure Laterality Date   ABDOMINAL HYSTERECTOMY      Immunization History  Administered Date(s) Administered   Moderna Sars-Covid-2 Vaccination 03/01/2019, 03/29/2019, 11/29/2019    MEDICATIONS/ALLERGIES   Current Meds  Medication Sig   aspirin 81 MG tablet Take 81 mg by mouth daily.   doxycycline (VIBRA-TABS) 100 MG tablet Take 100 mg by mouth daily.   prednisoLONE acetate (PRED FORTE) 1 % ophthalmic suspension SMARTSIG:In Eye(s)    Allergies  Allergen Reactions   Other     SOCIAL HISTORY/FAMILY HISTORY   Reviewed in Epic:  Pertinent findings:  Social History   Tobacco Use   Smoking status: Former   Smokeless tobacco: Never  Substance Use Topics   Alcohol use: Yes    Alcohol/week: 2.0 standard drinks    Types: 2 Standard drinks or equivalent per week   Drug use: No   Social History   Social History Narrative   Married mother of 2.  (Jessica-DOB 10/13/1980; Stephen-DOB 10/31/1985)   Lives with her husband.      Occasional exercise.  No routine exercise.   .   After seeing Dr. Ophelia Charter, Andrea Hopkins conferred with her mother about her father's history.  As it turns out, he never had a heart attack.  He had what was more little than  likely and panic attack at age 6.  He was never diagnosed with a heart attack and never had any evaluation.    OBJCTIVE -PE, EKG, labs   Wt Readings from Last 3 Encounters:  11/08/20 204 lb (92.5 kg)  09/02/16 196 lb (88.9 kg)  06/11/15 193 lb (87.5 kg)    Physical Exam: BP 130/80 (BP Location: Right Arm)   Pulse 85   Ht '5\' 4"'  (1.626 m)   Wt 204 lb (92.5 kg)   SpO2 99%   BMI 35.02 kg/m  Physical Exam Vitals reviewed.  Constitutional:      General: She is not in acute distress.    Appearance: She is obese. She is not ill-appearing or toxic-appearing.  HENT:     Head: Normocephalic and atraumatic.  Neck:     Vascular: No carotid bruit or JVD.  Cardiovascular:     Rate and Rhythm: Normal rate and regular rhythm. No extrasystoles are present.    Chest Wall:  PMI is not displaced (Very difficult to palpate due to body habitus).     Pulses: Normal pulses.     Heart sounds: S1 normal and S2 normal. Heart sounds are distant. No murmur heard.   No friction rub. No gallop.  Pulmonary:     Effort: Pulmonary effort is normal. No respiratory distress.     Breath sounds: Normal breath sounds. No wheezing, rhonchi or rales.  Chest:     Chest wall: No tenderness.  Musculoskeletal:        General: Swelling (Trivial puffy ankle swelling) present. Normal range of motion.     Cervical back: Normal range of motion and neck supple.  Skin:    General: Skin is warm and dry.  Neurological:     General: No focal deficit present.     Mental Status: She is alert and oriented to person, place, and time.  Psychiatric:        Mood and Affect: Mood normal.        Behavior: Behavior normal.        Thought Content: Thought content normal.        Judgment: Judgment normal.     Adult ECG Report  Rate: 85;  Rhythm: normal sinus rhythm and left atrial abnormality otherwise normal axis, intervals and durations. ;   Narrative Interpretation: Normal EKG.  Recent Labs:   04/04/2020: CRP 10.69; TC 154,  TG 74, HDL 58, LDL 92; TSH 3.02, T4 9.8, T3 uptake 24, free thyroxine index 2.4 (normal); Na+ 140, K+ 4.2, Cl- 102, HCO3-23, BUN 11, Cr 0.67, Glu 90, Ca2+ 9.3; AST 27, ALT 28, AlkP 128 (borderline elevated).  T bili 0.5.  T protein 7.2, Albumin 4.2 05/28/2020: AST/ALT 34/37.  Alk phos 131.  ==================================================  COVID-19 Education: The signs and symptoms of COVID-19 were discussed with the patient and how to seek care for testing (follow up with PCP or arrange E-visit).    I spent a total of 25 minutes with the patient spent in direct patient consultation.  Additional time spent with chart review  / charting (studies, outside notes, etc): 25 min Total Time: 50 min  Current medicines are reviewed at length with the patient today.  (+/- concerns) N/A  This visit occurred during the SARS-CoV-2 public health emergency.  Safety protocols were in place, including screening questions prior to the visit, additional usage of staff PPE, and extensive cleaning of exam room while observing appropriate contact time as indicated for disinfecting solutions.  Notice: This dictation was prepared with Dragon dictation along with smaller phrase technology. Any transcriptional errors that result from this process are unintentional and may not be corrected upon review.  Patient Instructions / Medication Changes & Studies & Tests Ordered   Patient Instructions  Medication Instructions:  No change   Lab Work: None ordered   Testing/Procedures: None ordered   Follow-Up: At [facility], you and your health needs are our priority.  As part of our continuing mission to provide you with exceptional heart care, we have created designated Provider Care Teams.  These Care Teams include your primary Cardiologist (physician) and Advanced Practice Providers (APPs -  Physician Assistants and Nurse Practitioners) who all work together to provide you with the care you need, when you need  it.  We recommend signing up for the patient portal called "MyChart".  Sign up information is provided on this After Visit Summary.  MyChart is used to connect with patients for Virtual Visits (Telemedicine).  Patients are  able to view lab/test results, encounter notes, upcoming appointments, etc.  Non-urgent messages can be sent to your provider as well.   To learn more about what you can do with MyChart, go to [email or web address].       Your next appointment:   2 years         The format for your next appointment: Office   Provider:  Dr.Aysa Larivee     Studies Ordered:   No orders of the defined types were placed in this encounter.    Glenetta Hew, M.D., M.S. Interventional Cardiologist   Pager # [phone] Phone # [phone] [address] [place], [place] [address]   Thank you for choosing Heartcare at [facility]!!

## 2020-11-19 ENCOUNTER — Encounter: Payer: Self-pay | Admitting: Cardiology

## 2020-11-19 DIAGNOSIS — R7982 Elevated C-reactive protein (CRP): Secondary | ICD-10-CM | POA: Insufficient documentation

## 2020-11-19 NOTE — Assessment & Plan Note (Signed)
I guess this is the main reason why she was referred.  Unfortunately I was not aware of this when I saw her.  Her lipid panel looks pretty well controlled and she is not having any symptoms.  For risk stratification, recommendation would be coronary calcium score.  Based on this we can determine if additional therapy would be required.  She is already taken aspirin which may or may not have benefit.  She does not have hypertension at baseline, and lipids are relatively well controlled.  Glucose level was normal.  Her major risk factor would be obesity which could also lead to elevated CRP levels.  When she left, the plan was for her to return in 2 years for follow-up.  This was before getting the clinic note from Dr. Radene Knee about CRP level. -->  We will contact her to see if she would be interested in checking a Coronary Calcium Score for risk stratification.

## 2020-11-21 ENCOUNTER — Telehealth: Payer: Self-pay

## 2020-11-21 NOTE — Telephone Encounter (Signed)
NOTES ON FILE FROM [facility] [phone], SENT REFERRAL TO SCHEDULING

## 2020-11-30 ENCOUNTER — Telehealth: Payer: Self-pay

## 2020-11-30 NOTE — Telephone Encounter (Signed)
NOTES SCANNED TO REFERRAL 

## 2021-09-26 ENCOUNTER — Other Ambulatory Visit: Payer: Self-pay | Admitting: Obstetrics and Gynecology

## 2021-09-26 DIAGNOSIS — R928 Other abnormal and inconclusive findings on diagnostic imaging of breast: Secondary | ICD-10-CM

## 2021-10-03 ENCOUNTER — Other Ambulatory Visit: Payer: Self-pay | Admitting: Obstetrics and Gynecology

## 2021-10-14 ENCOUNTER — Ambulatory Visit
Admission: RE | Admit: 2021-10-14 | Discharge: 2021-10-14 | Disposition: A | Discharge status: Home / Self Care | Payer: BC Managed Care – PPO | Source: Ambulatory Visit | Attending: Obstetrics and Gynecology | Admitting: Obstetrics and Gynecology

## 2021-10-14 DIAGNOSIS — R928 Other abnormal and inconclusive findings on diagnostic imaging of breast: Secondary | ICD-10-CM

## 2021-10-30 ENCOUNTER — Encounter: Payer: Self-pay | Admitting: Internal Medicine

## 2021-12-03 ENCOUNTER — Encounter: Payer: Self-pay | Admitting: Internal Medicine

## 2021-12-16 ENCOUNTER — Encounter: Payer: Self-pay | Admitting: Internal Medicine

## 2022-11-11 LAB — HM MAMMOGRAPHY

## 2022-11-11 LAB — HM DEXA SCAN: HM Dexa Scan: NORMAL

## 2022-11-12 ENCOUNTER — Encounter: Payer: Self-pay | Admitting: Family Medicine

## 2022-12-25 ENCOUNTER — Encounter: Payer: Self-pay | Admitting: Family Medicine

## 2022-12-25 ENCOUNTER — Ambulatory Visit (INDEPENDENT_AMBULATORY_CARE_PROVIDER_SITE_OTHER): Payer: BLUE CROSS/BLUE SHIELD | Admitting: Family Medicine

## 2022-12-25 VITALS — BP 118/78 | HR 82 | Ht 64.0 in | Wt 191.8 lb

## 2022-12-25 DIAGNOSIS — R7982 Elevated C-reactive protein (CRP): Secondary | ICD-10-CM

## 2022-12-25 DIAGNOSIS — Z803 Family history of malignant neoplasm of breast: Secondary | ICD-10-CM | POA: Diagnosis not present

## 2022-12-25 DIAGNOSIS — N814 Uterovaginal prolapse, unspecified: Secondary | ICD-10-CM | POA: Insufficient documentation

## 2022-12-25 DIAGNOSIS — Z90710 Acquired absence of both cervix and uterus: Secondary | ICD-10-CM | POA: Insufficient documentation

## 2022-12-25 DIAGNOSIS — H9313 Tinnitus, bilateral: Secondary | ICD-10-CM | POA: Insufficient documentation

## 2022-12-25 DIAGNOSIS — E66811 Obesity, class 1: Secondary | ICD-10-CM

## 2022-12-25 DIAGNOSIS — Z23 Encounter for immunization: Secondary | ICD-10-CM | POA: Diagnosis not present

## 2022-12-25 DIAGNOSIS — L718 Other rosacea: Secondary | ICD-10-CM | POA: Insufficient documentation

## 2022-12-25 DIAGNOSIS — L409 Psoriasis, unspecified: Secondary | ICD-10-CM | POA: Diagnosis not present

## 2022-12-25 NOTE — Progress Notes (Signed)
Andrea Hopkins is a 67 y.o. female who presents for welcome to Medicare visit and follow-up on chronic medical conditions.  She has had a hysterectomy.  She also has been having difficulty recently with a cystocele with prolapse and is having this monitored at the present time.  She is not interested in any major intervention.  She he is using estrogen cream.  She has also now started noting some ringing in her ears but at this point is not interested in pursuing this any further.  She does have a family history of breast cancer and does get mammograms yearly.  She also sees ophthalmology and has been diagnosed with ocular rosacea.  Her psoriasis is not causing any difficulty.  Immunizations and Health Maintenance Immunization History  Administered Date(s) Administered   Influenza-Unspecified 11/24/2020   Moderna Sars-Covid-2 Vaccination 03/01/2019, 03/29/2019, 11/29/2019   Zoster Recombinant(Shingrix) 03/07/2021   Health Maintenance Due  Topic Date Due   HIV Screening  Never done   Hepatitis C Screening  Never done   DTaP/Tdap/Td (1 - Tdap) Never done   Cervical Cancer Screening (HPV/Pap Cotest)  04/26/2017   Zoster Vaccines- Shingrix (2 of 2) 08/19/2021   Pneumonia Vaccine 63+ Years old (1 of 1 - PCV) Never done    Last Pap smear: March 2016 Last mammogram: September 2024 Last colonoscopy: August 2020 Last DEXA: September 2024 Dentist: Dr. Ninetta Lights Ophtho: Dr. Dione Booze and Dr. Noel Gerold  Exercise: Walking 20 minutes a day at work   Other doctors caring for patient include:  Advanced directives: No living will or power of attorney . Info given    Depression screen:  See questionnaire below.     12/25/2022   10:51 AM  Depression screen PHQ 2/9  Decreased Interest 0  Down, Depressed, Hopeless 0  PHQ - 2 Score 0    Fall Risk Screen: see questionnaire below.    12/25/2022   10:51 AM  Fall Risk   Falls in the past year? 0  Number falls in past yr: 0  Injury with Fall? 0     ADL screen:  See questionnaire below Functional Status Survey: Is the patient deaf or have difficulty hearing?: No Does the patient have difficulty seeing, even when wearing glasses/contacts?: No Does the patient have difficulty concentrating, remembering, or making decisions?: No (only age related changes) Does the patient have difficulty walking or climbing stairs?: No Does the patient have difficulty dressing or bathing?: No Does the patient have difficulty doing errands alone such as visiting a doctor's office or shopping?: No   Review of Systems Constitutional: -, -unexpected weight change, -anorexia, -fatigue Allergy: -sneezing, -itching, -congestion Dermatology: denies changing moles, rash, lumps ENT: -runny nose, -ear pain, -sore throat,  Cardiology:  -chest pain, -palpitations, -orthopnea, Respiratory: -cough, -shortness of breath, -dyspnea on exertion, -wheezing,  Gastroenterology: -abdominal pain, -nausea, -vomiting, -diarrhea, -constipation, -dysphagia Hematology: -bleeding or bruising problems Musculoskeletal: -arthralgias, -myalgias, -joint swelling, -back pain, - Ophthalmology: -vision changes,  Urology: -dysuria, -difficulty urinating,  -urinary frequency, -urgency, incontinence Neurology: -, -numbness, , -memory loss, -falls, -dizziness  She had a EKG in 2022  PHYSICAL EXAM:  BP 118/78   Pulse 82   Ht 5\' 4"  (1.626 m)   Wt 191 lb 12.8 oz (87 kg)   SpO2 95%   BMI 32.92 kg/m   General Appearance: Alert, cooperative, no distress, appears stated age Head: Normocephalic, without obvious abnormality, atraumatic Eyes: PERRL, conjunctiva/corneas clear, EOM's intact,  Ears: Normal TM's and external ear canals Nose: Nares  normal, mucosa normal, no drainage or sinus tenderness Throat: Lips, mucosa, and tongue normal; teeth and gums normal Neck: Supple, no lymphadenopathy;  thyroid:  no enlargement/tenderness/nodules; no carotid bruit or JVD Lungs: Clear to  auscultation bilaterally without wheezes, rales or ronchi; respirations unlabored Heart: Regular rate and rhythm, S1 and S2 normal, no murmur, rubor gallop Abdomen: Soft, non-tender, nondistended, normoactive bowel sounds,  no masses, no hepatosplenomegaly Extremities: No clubbing, cyanosis or edema Skin:  Skin color, texture, turgor normal, no rashes or lesions Lymph nodes: Cervical, supraclavicular, and axillary nodes normal Neurologic:  CNII-XII intact, normal strength, sensation and gait; reflexes 2+ and symmetric throughout Psych: Normal mood, affect, hygiene and grooming.  ASSESSMENT/PLAN: Obesity (BMI 30.0-34.9) - Plan: CBC with Differential/Platelet, Comprehensive metabolic panel, Lipid panel  Family history of breast cancer in sister  Psoriasis  Elevated C-reactive protein (CRP)  Need for vaccination against Streptococcus pneumoniae - Plan: Pneumococcal conjugate vaccine 20-valent (Prevnar 20)  Ocular rosacea  Need for influenza vaccination - Plan: Flu Vaccine Trivalent High Dose (Fluad)  Cystocele with prolapse  History of hysterectomy  Tinnitus of both ears    Discussed monthly self breast exams and yearly mammograms   Immunization recommendations discussed.  Colonoscopy recommendations reviewed   Medicare Attestation I have personally reviewed: The patient's medical and social history Their use of alcohol, tobacco or illicit drugs Their current medications and supplements The patient's functional ability including ADLs,fall risks, home safety risks, cognitive, and hearing and visual impairment Diet and physical activities Evidence for depression or mood disorders  The patient's weight, height, and BMI have been recorded in the chart.  I have made referrals, counseling, and provided education to the patient based on review of the above and I have provided the patient with a written personalized care plan for preventive services.     Sharlot Gowda,  MD   12/25/2022

## 2022-12-26 LAB — COMPREHENSIVE METABOLIC PANEL
ALT: 27 [IU]/L (ref 0–32)
AST: 26 [IU]/L (ref 0–40)
Albumin: 4.1 g/dL (ref 3.9–4.9)
Alkaline Phosphatase: 121 [IU]/L (ref 44–121)
BUN/Creatinine Ratio: 20 (ref 12–28)
BUN: 12 mg/dL (ref 8–27)
Bilirubin Total: 0.5 mg/dL (ref 0.0–1.2)
CO2: 22 mmol/L (ref 20–29)
Calcium: 9.2 mg/dL (ref 8.7–10.3)
Chloride: 104 mmol/L (ref 96–106)
Creatinine, Ser: 0.59 mg/dL (ref 0.57–1.00)
Globulin, Total: 2.7 g/dL (ref 1.5–4.5)
Glucose: 91 mg/dL (ref 70–99)
Potassium: 4.4 mmol/L (ref 3.5–5.2)
Sodium: 140 mmol/L (ref 134–144)
Total Protein: 6.8 g/dL (ref 6.0–8.5)
eGFR: 100 mL/min/{1.73_m2} (ref >59)

## 2022-12-26 LAB — LIPID PANEL
Chol/HDL Ratio: 2.7 ratio (ref 0.0–4.4)
Cholesterol, Total: 166 mg/dL (ref 100–199)
HDL: 61 mg/dL (ref >39)
LDL Chol Calc (NIH): 92 mg/dL (ref 0–99)
Triglycerides: 70 mg/dL (ref 0–149)
VLDL Cholesterol Cal: 13 mg/dL (ref 5–40)

## 2022-12-26 LAB — CBC WITH DIFFERENTIAL/PLATELET
Basophils Absolute: 0.1 10*3/uL (ref 0.0–0.2)
Basos: 1 %
EOS (ABSOLUTE): 0.2 10*3/uL (ref 0.0–0.4)
Eos: 3 %
Hematocrit: 39.6 % (ref 34.0–46.6)
Hemoglobin: 12.8 g/dL (ref 11.1–15.9)
Immature Grans (Abs): 0 10*3/uL (ref 0.0–0.1)
Immature Granulocytes: 1 %
Lymphocytes Absolute: 1.8 10*3/uL (ref 0.7–3.1)
Lymphs: 28 %
MCH: 27.5 pg (ref 26.6–33.0)
MCHC: 32.3 g/dL (ref 31.5–35.7)
MCV: 85 fL (ref 79–97)
Monocytes Absolute: 0.5 10*3/uL (ref 0.1–0.9)
Monocytes: 7 %
Neutrophils Absolute: 3.9 10*3/uL (ref 1.4–7.0)
Neutrophils: 60 %
Platelets: 298 10*3/uL (ref 150–450)
RBC: 4.65 x10E6/uL (ref 3.77–5.28)
RDW: 13.2 % (ref 11.7–15.4)
WBC: 6.5 10*3/uL (ref 3.4–10.8)

## 2023-01-12 IMAGING — MG MM DIGITAL DIAGNOSTIC UNILAT*L* W/ TOMO W/ CAD
4 series · 4 of 12 positions shown · non-contrast
Comparison: Previous exam(s).

CLINICAL DATA: Screening recall for a possible left breast mass.
Patient reports that she has 2 sisters with breast carcinoma.

EXAM:
DIGITAL DIAGNOSTIC UNILATERAL LEFT MAMMOGRAM WITH TOMOSYNTHESIS AND
CAD; ULTRASOUND LEFT BREAST LIMITED
TECHNIQUE: Left digital diagnostic mammography and breast tomosynthesis was
performed. The images were evaluated with computer-aided detection.;
Targeted ultrasound examination of the left breast was performed

[L CC synth-2D]
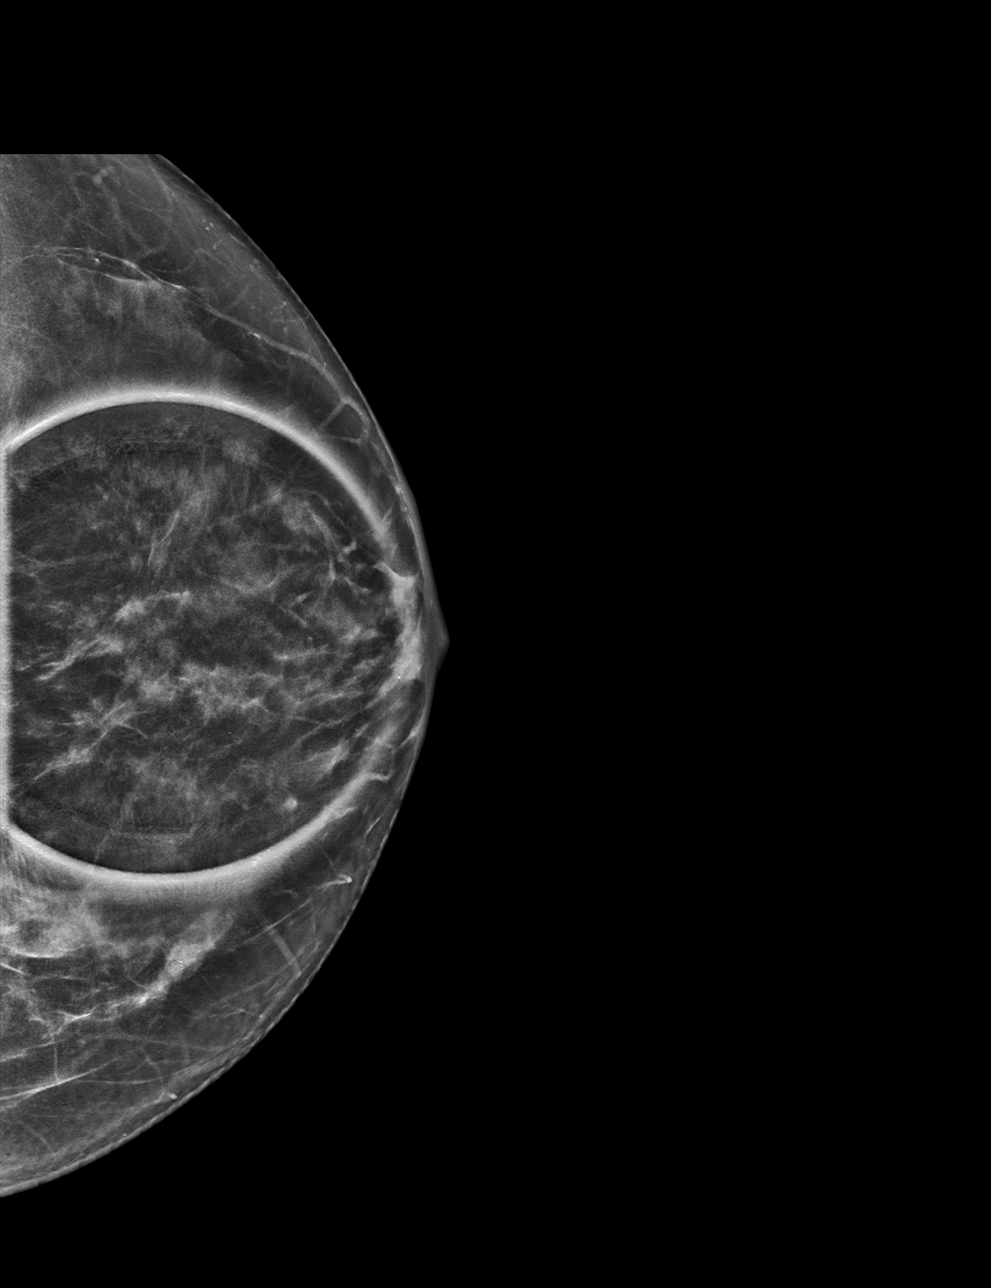

[L MLO synth-2D]
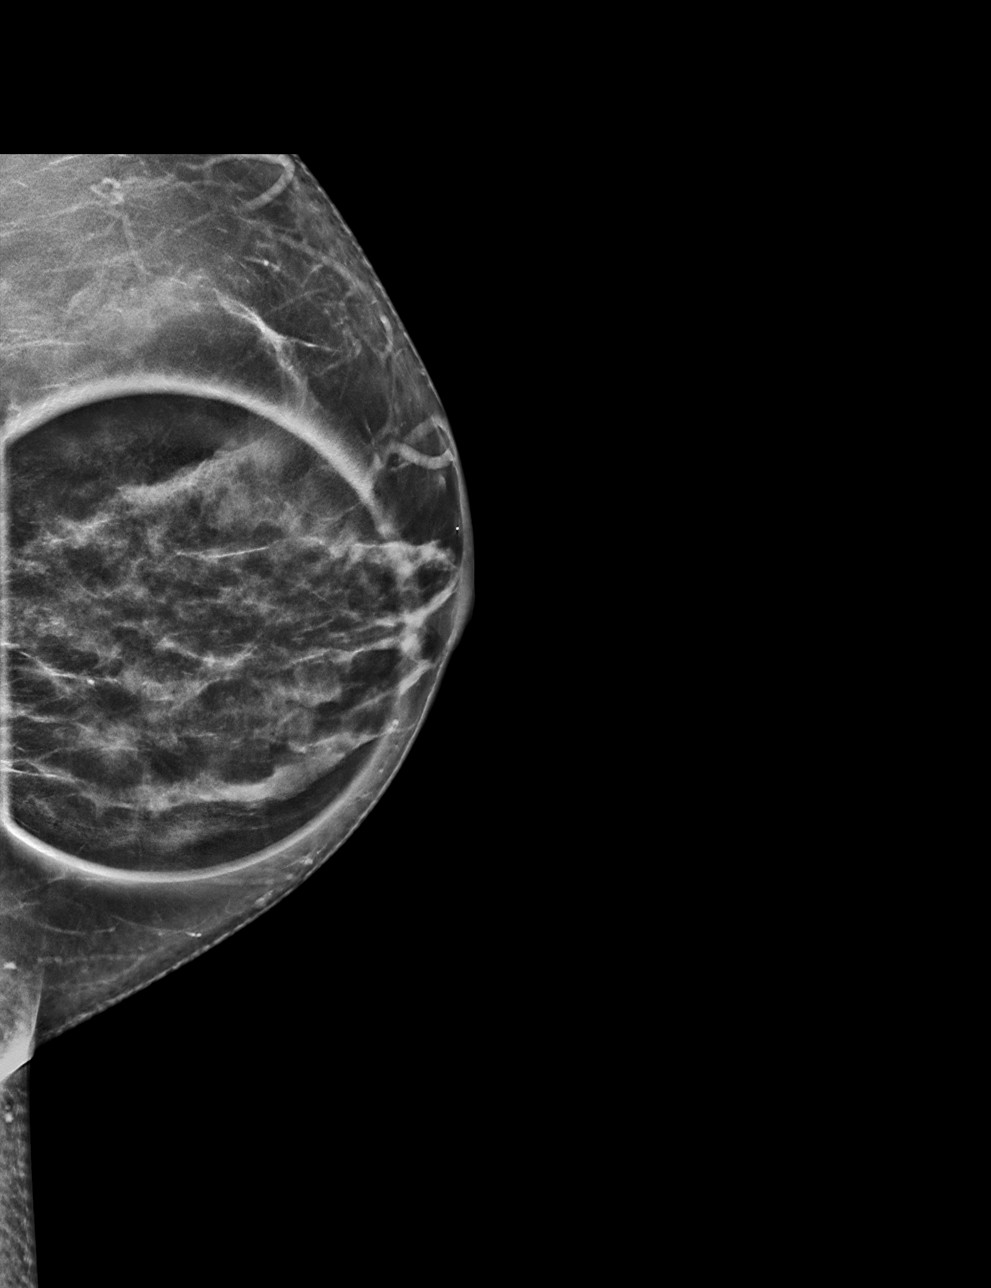

[L CC tomo · tomo slice 29/57.0]
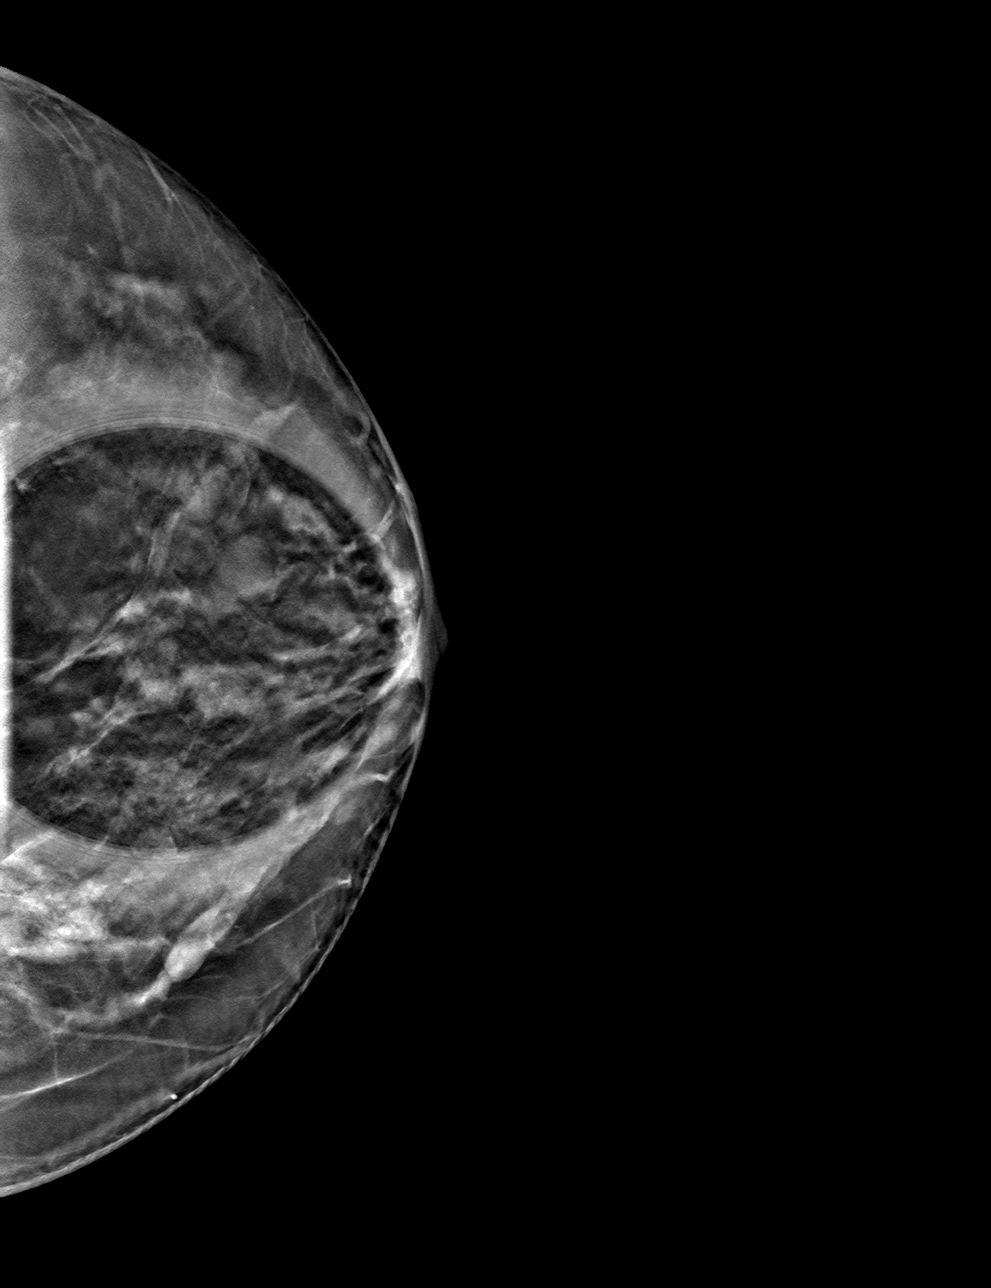

[L MLO tomo · tomo slice 33/65.0]
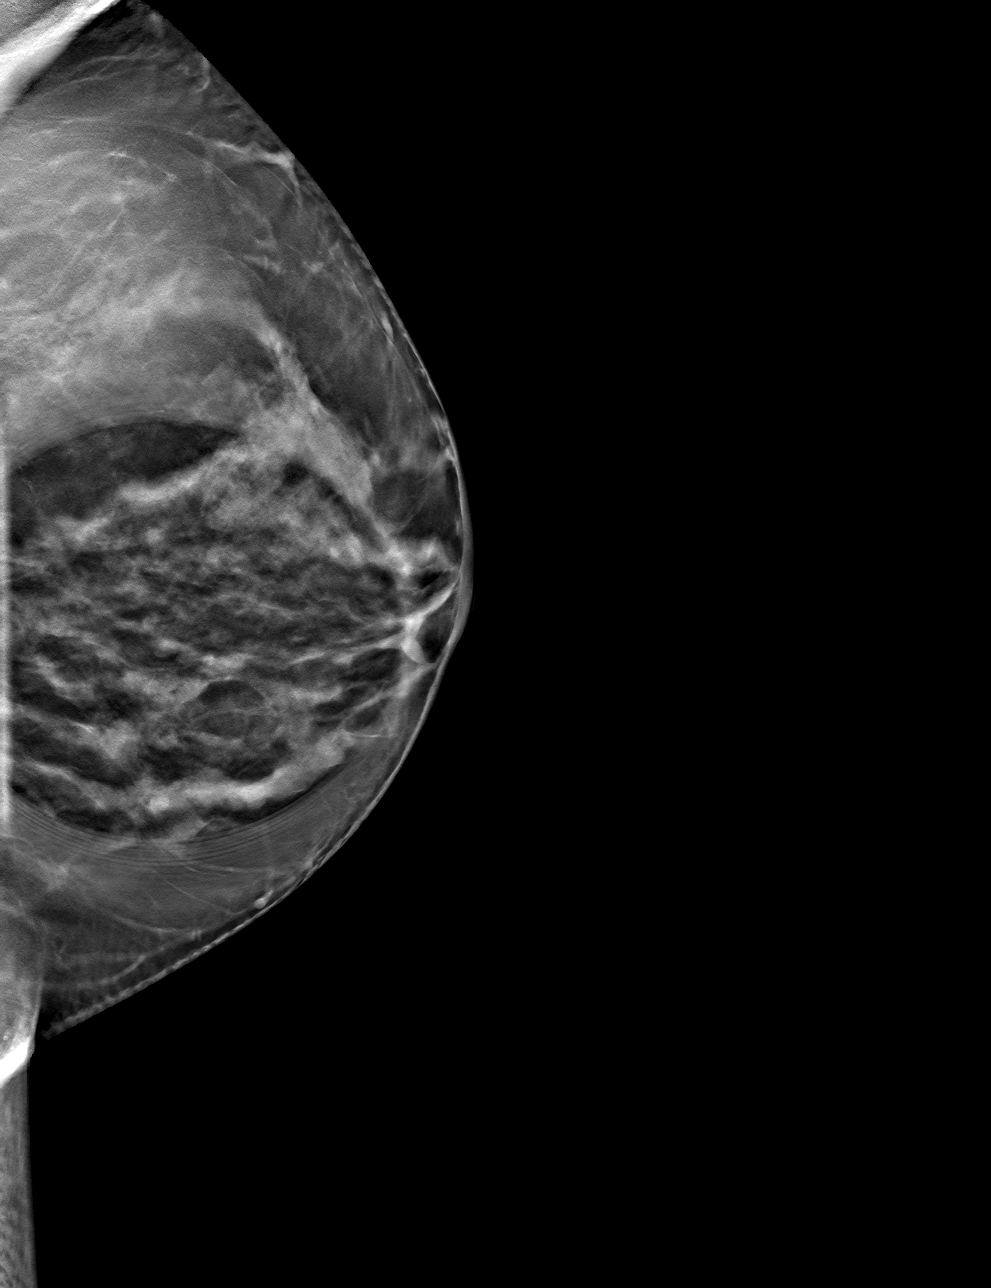

[4 of 12 positions shown; findings below may reference images not displayed]

ACR Breast Density Category c: The breast tissue is heterogeneously
dense, which may obscure small masses.
FINDINGS: The possible mass in the 6 o'clock retroareolar left breast persists
on the spot compression imaging, but is less well visualized being
faint and only with partly visualized, circumscribed margins. It
appears as an oval, proximally 6 mm mass. There is at least 1
additional circumscribed oval mass anterior to this that has been
stable.

There are no areas of architectural distortion and no suspicious
calcifications.

Targeted left breast ultrasound is performed, showing several cysts.
At 6 o'clock, 1 cm from nipple, there is a cyst measuring 8 x 7 x 7
mm. At 6 o'clock, 1 cm from the nipple there is also a cyst
measuring 6 x 4 x 7 mm. There are several additional smaller cyst.
No solid masses or suspicious lesions.
IMPRESSION: 1. No evidence of breast malignancy.
2. Benign left breast cysts, 1 of which accounts for the apparent
mass seen on the current screening study.

RECOMMENDATION:
Screening mammogram in one year.(Code:3A-M-63G)

Given the patient's elevated lifetime risk for developing breast
carcinoma, and her heterogeneously dense breast architecture on
mammography, annual supplemental high risk screening breast MRI is
recommended.

I have discussed the findings and recommendations with the patient.
If applicable, a reminder letter will be sent to the patient
regarding the next appointment.

BI-RADS CATEGORY  2: Benign.

## 2023-01-21 ENCOUNTER — Encounter: Payer: Self-pay | Admitting: Family Medicine

## 2023-04-21 ENCOUNTER — Encounter: Payer: Self-pay | Admitting: Internal Medicine

## 2023-11-13 ENCOUNTER — Other Ambulatory Visit: Payer: Self-pay | Admitting: Family

## 2023-11-13 DIAGNOSIS — Z9189 Other specified personal risk factors, not elsewhere classified: Secondary | ICD-10-CM

## 2024-01-05 ENCOUNTER — Encounter: Payer: Self-pay | Admitting: Family Medicine

## 2024-01-05 ENCOUNTER — Ambulatory Visit (INDEPENDENT_AMBULATORY_CARE_PROVIDER_SITE_OTHER): Payer: Medicare Other | Admitting: Family Medicine

## 2024-01-05 VITALS — BP 124/80 | HR 79 | Ht 64.0 in | Wt 196.8 lb

## 2024-01-05 DIAGNOSIS — G8929 Other chronic pain: Secondary | ICD-10-CM

## 2024-01-05 DIAGNOSIS — M25561 Pain in right knee: Secondary | ICD-10-CM

## 2024-01-05 DIAGNOSIS — Z803 Family history of malignant neoplasm of breast: Secondary | ICD-10-CM

## 2024-01-05 DIAGNOSIS — Z0000 Encounter for general adult medical examination without abnormal findings: Secondary | ICD-10-CM

## 2024-01-05 DIAGNOSIS — Z23 Encounter for immunization: Secondary | ICD-10-CM | POA: Diagnosis not present

## 2024-01-05 DIAGNOSIS — E66811 Obesity, class 1: Secondary | ICD-10-CM

## 2024-01-05 DIAGNOSIS — Z87891 Personal history of nicotine dependence: Secondary | ICD-10-CM | POA: Diagnosis not present

## 2024-01-05 DIAGNOSIS — Z136 Encounter for screening for cardiovascular disorders: Secondary | ICD-10-CM

## 2024-01-05 DIAGNOSIS — M25562 Pain in left knee: Secondary | ICD-10-CM

## 2024-01-05 LAB — LIPID PANEL

## 2024-01-05 NOTE — Addendum Note (Signed)
 Addended by: LATTIE ERNST C on: 01/05/2024 09:00 AM   Modules accepted: Orders

## 2024-01-05 NOTE — Progress Notes (Signed)
 Name: [name]   Date of Visit: 01/05/24   Date of last visit with me: Visit date not found   CHIEF COMPLAINT:  Chief Complaint  Patient presents with   Annual Exam    Cpe and awv.        HPI:  Discussed the use of AI scribe software for clinical note transcription with the patient, who gave verbal consent to proceed.  History of Present Illness Andrea Hopkins is a 68 year old female who presents for a physical exam and Medicare wellness visit.  She experiences intermittent knee pain and has not had knee x-rays since 2017. Her youngest sister is undergoing knee replacement surgery due to bone-on-bone contact.  She has a history of eye issues that previously required prednisone during flare-ups, but she is not currently using it. Her eye care is managed by Dr. Nena Hopkins at [facility].  She is a former smoker, having quit around 2001-2002 with the help of Wellbutrin. Her father died of lung cancer, and she is considering a low-dose CT scan for lung cancer screening.  There is a family history of breast cancer, with two sisters affected. She undergoes annual mammograms and has had a breast MRI in the past due to dense breast tissue.  Her bladder has prolapsed slightly since her 67th birthday but is currently stable.     OBJECTIVE:       01/05/2024    8:36 AM  Depression screen PHQ 2/9  Decreased Interest 0  Down, Depressed, Hopeless 0  PHQ - 2 Score 0     BP Readings from Last 3 Encounters:  01/05/24 124/80  12/25/22 118/78  11/08/20 130/80    BP 124/80   Pulse 79   Ht 5' 4 (1.626 m)   Wt 196 lb 12.8 oz (89.3 kg)   SpO2 97%   BMI 33.78 kg/m    Physical Exam    Physical Exam Constitutional:      Appearance: Normal appearance.  Neurological:     General: No focal deficit present.     Mental Status: She is alert and oriented to person, place, and time. Mental status is at baseline.     ASSESSMENT/PLAN:   Assessment &  Plan Obesity (BMI 30.0-34.9)  Annual physical exam  Chronic pain of both knees  Former smoker  Family history of breast cancer in sister  Encounter for screening for cardiovascular disorders    Assessment and Plan Assessment & Plan Adult Wellness Visit Routine wellness visit with normal labs, healthy weight, controlled blood pressure and cholesterol. - Performed basic labs. - Scheduled Medicare wellness visit before year-end. -Comprehensive annual physical exam completed today. Reviewed interval history, current medical issues, medications, allergies, and preventive care needs. Addressed all patient questions and concerns. Discussed lifestyle factors including diet, exercise, sleep, and stress management. Reviewed recommended age-appropriate screenings, labs, and vaccinations. Counseling provided on healthy habits and routine health maintenance. Follow-up as indicated based on findings and results. - Administer TDAP   Right knee pain Intermittent pain, no recent imaging since 2017, no surgical intervention needed. - Ordered right knee x-rays at [facility]. - Review x-ray results during Medicare wellness visit.  Obesity, class 1 Class 1 obesity with recent weight loss, weight is healthy, not affecting blood pressure or cholesterol.  Bladder prolapse Recent onset with symptom improvement, scheduled for pelvic floor physical therapy. - Continue with pelvic floor physical therapy.  History of nicotine dependence with lung cancer screening Former smoker with  family history of lung cancer, recommended low-dose CT scan. - Ordered low-dose CT scan for lung cancer screening.  Screening for breast cancer with family history Family history of breast cancer, previous mammogram showed dense breast tissue, recommended breast MRI. - Scheduled breast MRI at [facility].  Eye condition, episodic, not currently active Episodic condition managed with prednisone during  flare-ups, currently inactive.     Andrea Hopkins A. Vita MD [facility]

## 2024-01-06 ENCOUNTER — Ambulatory Visit: Payer: Self-pay | Admitting: Family Medicine

## 2024-01-06 LAB — COMPREHENSIVE METABOLIC PANEL WITH GFR
ALT: 31 IU/L (ref 0–32)
AST: 26 IU/L (ref 0–40)
Albumin: 4.3 g/dL (ref 3.9–4.9)
Alkaline Phosphatase: 116 IU/L (ref 49–135)
BUN/Creatinine Ratio: 27 (ref 12–28)
BUN: 16 mg/dL (ref 8–27)
Bilirubin Total: 0.5 mg/dL (ref 0.0–1.2)
CO2: 22 mmol/L (ref 20–29)
Calcium: 9.4 mg/dL (ref 8.7–10.3)
Chloride: 103 mmol/L (ref 96–106)
Creatinine, Ser: 0.6 mg/dL (ref 0.57–1.00)
Globulin, Total: 2.5 g/dL (ref 1.5–4.5)
Glucose: 96 mg/dL (ref 70–99)
Potassium: 4.4 mmol/L (ref 3.5–5.2)
Sodium: 138 mmol/L (ref 134–144)
Total Protein: 6.8 g/dL (ref 6.0–8.5)
eGFR: 99 mL/min/1.73 (ref >59)

## 2024-01-06 LAB — CBC WITH DIFFERENTIAL/PLATELET
Basophils Absolute: 0.1 x10E3/uL (ref 0.0–0.2)
Basos: 1 %
EOS (ABSOLUTE): 0.2 x10E3/uL (ref 0.0–0.4)
Eos: 3 %
Hematocrit: 42.3 % (ref 34.0–46.6)
Hemoglobin: 13.5 g/dL (ref 11.1–15.9)
Immature Grans (Abs): 0 x10E3/uL (ref 0.0–0.1)
Immature Granulocytes: 0 %
Lymphocytes Absolute: 1.3 x10E3/uL (ref 0.7–3.1)
Lymphs: 22 %
MCH: 27.7 pg (ref 26.6–33.0)
MCHC: 31.9 g/dL (ref 31.5–35.7)
MCV: 87 fL (ref 79–97)
Monocytes Absolute: 0.4 x10E3/uL (ref 0.1–0.9)
Monocytes: 7 %
Neutrophils Absolute: 4.1 x10E3/uL (ref 1.4–7.0)
Neutrophils: 66 %
Platelets: 262 x10E3/uL (ref 150–450)
RBC: 4.88 x10E6/uL (ref 3.77–5.28)
RDW: 13.1 % (ref 11.7–15.4)
WBC: 6.1 x10E3/uL (ref 3.4–10.8)

## 2024-01-06 LAB — LIPID PANEL
Cholesterol, Total: 179 mg/dL (ref 100–199)
HDL: 60 mg/dL (ref >39)
LDL CALC COMMENT:: 3 ratio (ref 0.0–4.4)
LDL Chol Calc (NIH): 103 mg/dL — AB (ref 0–99)
Triglycerides: 90 mg/dL (ref 0–149)
VLDL Cholesterol Cal: 16 mg/dL (ref 5–40)

## 2024-01-07 ENCOUNTER — Telehealth: Payer: Self-pay

## 2024-01-07 NOTE — Telephone Encounter (Signed)
 Copied from CRM [id number]. Topic: Clinical - Lab/Test Results >> Jan 07, 2024  2:32 PM Antwanette L wrote: Reason for CRM: Patient is returning a missed from [place] regarding lab results. Please follow up with the patient at [phone]  Returned patients phone called and advised her of results.

## 2024-01-29 ENCOUNTER — Ambulatory Visit
Admission: RE | Admit: 2024-01-29 | Discharge: 2024-01-29 | Disposition: A | Source: Ambulatory Visit | Attending: Family Medicine

## 2024-01-29 DIAGNOSIS — G8929 Other chronic pain: Secondary | ICD-10-CM

## 2024-02-05 ENCOUNTER — Ambulatory Visit: Admitting: Family Medicine

## 2024-02-12 ENCOUNTER — Encounter: Payer: Self-pay | Admitting: Family Medicine

## 2024-02-12 ENCOUNTER — Ambulatory Visit: Admitting: Family Medicine

## 2024-02-12 VITALS — BP 112/68 | HR 82 | Ht 64.0 in | Wt 197.2 lb

## 2024-02-12 DIAGNOSIS — Z0000 Encounter for general adult medical examination without abnormal findings: Secondary | ICD-10-CM | POA: Diagnosis not present

## 2024-02-12 DIAGNOSIS — M25561 Pain in right knee: Secondary | ICD-10-CM | POA: Diagnosis not present

## 2024-02-12 DIAGNOSIS — L409 Psoriasis, unspecified: Secondary | ICD-10-CM

## 2024-02-12 DIAGNOSIS — M25562 Pain in left knee: Secondary | ICD-10-CM

## 2024-02-12 DIAGNOSIS — G8929 Other chronic pain: Secondary | ICD-10-CM | POA: Insufficient documentation

## 2024-02-12 DIAGNOSIS — Z720 Tobacco use: Secondary | ICD-10-CM

## 2024-02-12 MED ORDER — MELOXICAM 15 MG PO TABS: 15.0000 mg | ORAL_TABLET | Freq: Every day | ORAL | 0 refills | Status: AC

## 2024-02-12 MED ORDER — CLOBETASOL PROPIONATE 0.05 % EX CREA: 1.0000 | TOPICAL_CREAM | Freq: Two times a day (BID) | CUTANEOUS | 0 refills | Status: AC

## 2024-02-12 NOTE — Progress Notes (Signed)
 "  Name: Andrea Hopkins   Date of Visit: 02/12/2024   Date of last visit with me: 01/05/2024   CHIEF COMPLAINT:  Chief Complaint  Patient presents with   Medicare Wellness    Awv.       HPI:  Discussed the use of AI scribe software for clinical note transcription with the patient, who gave verbal consent to proceed.  History of Present Illness   Andrea Hopkins is a 68 year old female with knee pain and psoriasis who presents for evaluation of her arthritis and psoriasis management.  She experiences persistent knee pain with a daily severity of 3 to 4 out of 10. The pain worsens with prolonged standing or walking. She has not been using compression sleeves due to discomfort with their tightness, having tried a large size but may need an XL. A previous steroid injection provided relief.  She has a long-standing history of psoriasis, primarily affecting her feet and legs, accompanied by itchiness. She uses Vaseline to manage dryness but has not been using any steroid creams recently.  She quit smoking 24 years ago. There was consideration for a CT scan for lung cancer screening, but there was confusion regarding the timing and coverage due to her smoking history.  Her family history includes a son diagnosed with juvenile rheumatoid arthritis at age 1, which later transitioned to ankylosing spondylitis. She also has a sister with breast cancer and another sister with strokes and neck issues.  In terms of social history, she does not consume coffee and has reduced her intake of soft drinks, only using them occasionally for heartburn relief. She engages in yoga for physical activity and does not eat breakfast, typically having tea or coffee in the morning.         OBJECTIVE:       02/12/2024    9:05 AM  Depression screen PHQ 2/9  Decreased Interest 0  Down, Depressed, Hopeless 0  PHQ - 2 Score 0     BP Readings from Last 3 Encounters:  02/12/24 112/68  01/05/24  124/80  12/25/22 118/78    BP 112/68   Pulse 82   Ht 5' 4 (1.626 m)   Wt 197 lb 3.2 oz (89.4 kg)   SpO2 95%   BMI 33.85 kg/m    Physical Exam          Physical Exam Constitutional:      Appearance: Normal appearance.  Neurological:     General: No focal deficit present.     Mental Status: She is alert and oriented to person, place, and time.     ASSESSMENT/PLAN:   Assessment & Plan Encounter for Medicare annual wellness exam  Tobacco abuse  Bilateral chronic knee pain  Psoriasis    Assessment and Plan    Bilateral knee osteoarthritis Moderate to severe osteoarthritis with joint space narrowing and tibial sclerosis. Psoriasis may accelerate progression. Previous steroid injection effective. - Recommended compression sleeves during prolonged standing or walking. - Prescribed meloxicam  for flare-ups, to be taken as needed for 1-3 days. - Advised ice for inflammation, avoid heat. - Discussed potential steroid injection if pain increases to 5-6/10. - Encouraged strength training and yoga for joint stability and health.  Psoriasis Chronic psoriasis with lesions on feet and occasional spots. - Prescribed clobetasol  for flare-ups if Vaseline is insufficient.  General health maintenance Discussed preventive health measures and potential future statin use due to family history of cardiovascular issues. - Encouraged strength training twice a  week for joint health. - Discussed potential future statin use for cardiovascular prevention.         Makinzi Prieur A. Vita MD [facility] "

## 2024-02-12 NOTE — Progress Notes (Signed)
 "   Chief Complaint  Patient presents with   Medicare Wellness    Awv.     Subjective:   Andrea Hopkins is a 68 y.o. female who presents for a Medicare Annual Wellness Visit.  Visit info / Clinical Intake: Medicare Wellness Visit Type:: Subsequent Annual Wellness Visit Persons participating in visit and providing information:: patient Medicare Wellness Visit Mode:: In-person (required for WTM) Interpreter Needed?: No Pre-visit prep was completed: no AWV questionnaire completed by patient prior to visit?: no Living arrangements:: lives with spouse/significant other Patient's Overall Health Status Rating: good Typical amount of pain: some Does pain affect daily life?: no Are you currently prescribed opioids?: no  Dietary Habits and Nutritional Risks How many meals a day?: 3 Eats fruit and vegetables daily?: yes Most meals are obtained by: preparing own meals; eating out In the last 2 weeks, have you had any of the following?: none Diabetic:: no  Functional Status Activities of Daily Living (to include ambulation/medication): Independent Ambulation: Independent Medication Administration: Independent Home Management (perform basic housework or laundry): Independent Manage your own finances?: yes Primary transportation is: driving Concerns about vision?: (!) yes Concerns about hearing?: no  Fall Screening Falls in the past year?: 0 Number of falls in past year: 0 Was there an injury with Fall?: 0 Fall Risk Category Calculator: 0 Patient Fall Risk Level: Low Fall Risk  Fall Risk Patient at Risk for Falls Due to: No Fall Risks Fall risk Follow up: Falls evaluation completed  Home and Transportation Safety: All rugs have non-skid backing?: yes All stairs or steps have railings?: yes Grab bars in the bathtub or shower?: (!) no Have non-skid surface in bathtub or shower?: yes Good home lighting?: yes Regular seat belt use?: yes Hospital stays in the last year::  no  Cognitive Assessment Difficulty concentrating, remembering, or making decisions? : no Will 6CIT or Mini Cog be Completed: yes What year is it?: 0 points What month is it?: 0 points Give patient an address phrase to remember (5 components): [address] About what time is it?: 0 points Count backwards from 20 to 1: 0 points Say the months of the year in reverse: 0 points  Advance Directives (For Healthcare) Does Patient Have a Medical Advance Directive?: No Would patient like information on creating a medical advance directive?: No - Patient declined  Reviewed/Updated  Reviewed/Updated: Reviewed All (Medical, Surgical, Family, Medications, Allergies, Care Teams, Patient Goals)    Allergies (verified) Patient has no known allergies.   Current Medications (verified) Outpatient Encounter Medications as of 02/12/2024  Medication Sig   Anhydrous Base (PCCA ELLAGE VAGINAL) CREA Place 1 Syringe vaginally once a week. Estradiol 0.02% cream   aspirin  81 MG tablet Take 81 mg by mouth daily.   doxycycline (VIBRA-TABS) 100 MG tablet Take 100 mg by mouth daily. (Patient taking differently: Take 100 mg by mouth daily. Taking once a week)   RESTASIS 0.05 % ophthalmic emulsion Place 1 drop into both eyes in the morning and at bedtime.   No facility-administered encounter medications on file as of 02/12/2024.    History: Past Medical History:  Diagnosis Date   Ocular rosacea    Psoriasis    Uterine fibroids    s/p Lap-assisted vaginal hysterectomy   Past Surgical History:  Procedure Laterality Date   ABDOMINAL HYSTERECTOMY     Family History  Problem Relation Age of Onset   Anxiety disorder Father 44       He had what sounds like  a panic attack at age 33-initially felt to be a heart attack, but was never diagnosed   Lung cancer Father 97       Was a long-term smoker   Breast cancer Sister 52   Breast cancer Sister    Breast cancer Maternal Aunt    Social History    Occupational History   Not on file  Tobacco Use   Smoking status: Former   Smokeless tobacco: Never  Substance and Sexual Activity   Alcohol use: Yes    Alcohol/week: 2.0 standard drinks of alcohol    Types: 2 Standard drinks or equivalent per week   Drug use: No   Sexual activity: Yes    Partners: Male   Tobacco Counseling Counseling given: Not Answered  SDOH Screenings   Depression (PHQ2-9): Low Risk (02/12/2024)  Social Connections: Unknown (07/09/2021)   Received from [facility]  Tobacco Use: Medium Risk (02/12/2024)   See flowsheets for full screening details  Depression Screen PHQ 2 & 9 Depression Scale- Over the past 2 weeks, how often have you been bothered by any of the following problems? Little interest or pleasure in doing things: 0 Feeling down, depressed, or hopeless (PHQ Adolescent also includes...irritable): 0 PHQ-2 Total Score: 0     Goals Addressed             This Visit's Progress    Patient Stated       Eat healthier, lose weight, become more active.             Objective:    There were no vitals filed for this visit. There is no height or weight on file to calculate BMI.  Hearing/Vision screen No results found. Immunizations and Health Maintenance Health Maintenance  Topic Date Due   Hepatitis C Screening  Never done   COVID-19 Vaccine (5 - 2025-26 season) 10/26/2023   Zoster Vaccines- Shingrix (2 of 2) 03/25/2024 (Originally 09/04/2021)   Mammogram  11/10/2024   Medicare Annual Wellness (AWV)  02/11/2025   Colonoscopy  10/07/2028   DTaP/Tdap/Td (2 - Td or Tdap) 01/04/2034   Pneumococcal Vaccine: 50+ Years  Completed   Influenza Vaccine  Completed   Bone Density Scan  Completed   Meningococcal B Vaccine  Aged Out        Assessment/Plan:  This is a routine wellness examination for [place].  Patient Care Team: Nakul Avino, MD as PCP - General (Family Medicine)  I have personally reviewed and noted the following in  the patients chart:   Medical and social history Use of alcohol, tobacco or illicit drugs  Current medications and supplements including opioid prescriptions. Functional ability and status Nutritional status Physical activity Advanced directives List of other physicians Hospitalizations, surgeries, and ER visits in previous 12 months Vitals Screenings to include cognitive, depression, and falls Referrals and appointments  No orders of the defined types were placed in this encounter.  In addition, I have reviewed and discussed with patient certain preventive protocols, quality metrics, and best practice recommendations. A written personalized care plan for preventive services as well as general preventive health recommendations were provided to patient.   >=15 minutes spent in face-to-face counseling focused on reducing cardiovascular risk.  Topics reviewed: - Assessment of individual CVD risk factors (BP, lipids, glucose, weight, activity level) - Lifestyle modifications including heart-healthy diet, sodium reduction, and increased physical activity - Smoking cessation and alcohol moderation when applicable - Importance of blood pressure control, lipid optimization, and medication adherence - Personalized risk-reduction  plan discussed and agreed upon     Carlo JAYSON Salmon, CMA   02/12/2024   No follow-ups on file.  After Visit Summary: (In Person-Printed) AVS printed and given to the patient  Nurse Notes: none "

## 2024-02-26 NOTE — Progress Notes (Signed)
 " [facility] - Clinic Note  03/02/2024   CHIEF COMPLAINT Patient presents for Retina Evaluation  HISTORY OF PRESENT ILLNESS: Andrea Hopkins is a 69 y.o. female who presents to the clinic today for:  HPI     Retina Evaluation   In left eye.  This started 2 years ago.  Associated Symptoms Redness.  I, the attending physician,  performed the HPI with the patient and updated documentation appropriately.        Comments   Pt states Dr.Cohen has been treating her for ocular rosacea for a few years. Pt c/o itchiness OU. Pt uses Refresh OU. Pt takes doxycline once a week. Pt uses Restasis OU BID. Pt denies FOL/ floaters/ pain. Pt denies distortion OU.       Last edited by Valdemar Rogue, MD on 03/08/2024 10:11 PM.     Pt states she has seen Dr. Gust since March 2025. She was told a retinal specialist needed to give her a baseline eval. Pt reports itching OU. Taking doxycyline once a week. Sister has Stargardt's disease.  Referring physician: Gust Penna, MD [facility] [address] [place],  [place] [address]  HISTORICAL INFORMATION:  Selected notes from the MEDICAL RECORD NUMBER Referred by Dr. Particia Gust for retina eval, possible ERM LEE:  Ocular Hx- PMH-   CURRENT MEDICATIONS: Current Outpatient Medications (Ophthalmic Drugs)  Medication Sig   RESTASIS 0.05 % ophthalmic emulsion Place 1 drop into both eyes in the morning and at bedtime.   No current facility-administered medications for this visit. (Ophthalmic Drugs)   Current Outpatient Medications (Other)  Medication Sig   clobetasol  cream (TEMOVATE ) 0.05 % Apply 1 Application topically 2 (two) times daily.   doxycycline (VIBRA-TABS) 100 MG tablet Take 100 mg by mouth daily.   meloxicam  (MOBIC ) 15 MG tablet Take 1 tablet (15 mg total) by mouth daily.   Anhydrous Base (PCCA ELLAGE VAGINAL) CREA Place 1 Syringe vaginally once a week. Estradiol 0.02% cream   aspirin  81 MG tablet Take  81 mg by mouth daily.   No current facility-administered medications for this visit. (Other)   REVIEW OF SYSTEMS:  ALLERGIES Allergies[1] PAST MEDICAL HISTORY Past Medical History:  Diagnosis Date   Ocular rosacea    Psoriasis    Uterine fibroids    s/p Lap-assisted vaginal hysterectomy   Past Surgical History:  Procedure Laterality Date   ABDOMINAL HYSTERECTOMY     FAMILY HISTORY Family History  Problem Relation Age of Onset   Anxiety disorder Father 24       He had what sounds like a panic attack at age 24-initially felt to be a heart attack, but was never diagnosed   Lung cancer Father 15       Was a long-term smoker   Breast cancer Sister 51   Breast cancer Sister    Breast cancer Maternal Aunt    SOCIAL HISTORY Social History[2]     OPHTHALMIC EXAM:  Base Eye Exam     Visual Acuity (Snellen - Linear)       Right Left   Dist cc 20/40 -1 20/30   Dist ph cc NI 20/20 -3    Correction: Glasses         Tonometry (Tonopen, 9:16 AM)       Right Left   Pressure 14 14         Pupils       Pupils Dark Light Shape React APD  Right PERRL 3 2 Round Brisk None   Left PERRL 3 2 Round Brisk None         Visual Fields       Left Right    Full Full         Extraocular Movement       Right Left    Full, Ortho Full, Ortho         Neuro/Psych     Oriented x3: Yes         Dilation     Both eyes: 1.0% Mydriacyl, 2.5% Phenylephrine @ 9:17 AM           Slit Lamp and Fundus Exam     External Exam       Right Left   External Normal Normal         Slit Lamp Exam       Right Left   Lids/Lashes Dermatochalasis, mild MGD Dermatochalasis, mild MGD   Conjunctiva/Sclera White and quiet White and quiet   Cornea 1-2+ PEE, trace tear film debris 2+ PEE, mild tear film debris   Anterior Chamber Deep and Clear Deep and Clear   Iris Round and Dilated Round and Dilated   Lens 1-2+ Nuclear sclerosis, 1-2+ Cortical cataract, 1+PSC 1-2+  Nuclear sclerosis, 1-2+ Cortical cataract, 1+PSC   Anterior Vitreous mild syneresis mild syneresis, Posterior vitreous detachment         Fundus Exam       Right Left   Disc Pink and Sharp, mild tilt Pink and Sharp   C/D Ratio 0.3 0.2   Macula Flat, Good foveal reflex, RPE mottling, no heme or edema Flat, Good foveal reflex, RPE mottling, no heme or edema   Vessels mild attenuation, mild tortuosity mild attenuation, mild tortuosity   Periphery Attached, no heme, mild reticular degeneration Attached, no heme           Refraction     Wearing Rx       Sphere Cylinder Axis Add   Right -3.75 +2.25 119 +1.50   Left -0.75 +1.00 072 +1.50    Age: 45         Manifest Refraction (Subjective)       Sphere Cylinder Axis Dist VA   Right -3.25 +2.75 125 20/30-2   Left               IMAGING AND PROCEDURES  Imaging and Procedures for 03/02/2024  OCT, Retina - OU - Both Eyes       Right Eye Quality was good. Central Foveal Thickness: 256. Progression has no prior data. Findings include normal foveal contour, no IRF, no SRF, vitreomacular adhesion .   Left Eye Quality was good. Central Foveal Thickness: 331. Progression has no prior data. Findings include normal foveal contour, no IRF, no SRF.   Notes *Images captured and stored on drive  Diagnosis / Impression:  NFP; no IRF/SRF OU  Clinical management:  See below  Abbreviations: NFP - Normal foveal profile. CME - cystoid macular edema. PED - pigment epithelial detachment. IRF - intraretinal fluid. SRF - subretinal fluid. EZ - ellipsoid zone. ERM - epiretinal membrane. ORA - outer retinal atrophy. ORT - outer retinal tubulation. SRHM - subretinal hyper-reflective material. IRHM - intraretinal hyper-reflective material           ASSESSMENT/PLAN:   ICD-10-CM   1. Ocular rosacea  L71.8 OCT, Retina - OU - Both Eyes    2. Combined forms of age-related cataract of both eyes  Y74.186      Ocular Rosacea - Managed  by Dr. Gust - Takes Doxycycline once weekly  - OCT - NFP, no IRF/SRF OU - dilated exam -- no ocular inflammation or ERM OU - f/u PRN  2. Family History of Stargardt's Disease--sister  3. Mixed Cataract OU - The symptoms of cataract, surgical options, and treatments and risks were discussed with patient. - discussed diagnosis and progression - monitor    Ophthalmic Meds Ordered this visit:  No orders of the defined types were placed in this encounter.    Return if symptoms worsen or fail to improve.  There are no Patient Instructions on file for this visit.  Explained the diagnoses, plan, and follow up with the patient and they expressed understanding.  Patient expressed understanding of the importance of proper follow up care.   This document serves as a record of services personally performed by Redell JUDITHANN Hans, MD, PhD. It was created on their behalf by Wanda GEANNIE Keens, COT an ophthalmic technician. The creation of this record is the provider's dictation and/or activities during the visit.    Electronically signed by:  Wanda GEANNIE Keens, COT  03/09/2024 2:21 AM  This document serves as a record of services personally performed by Redell JUDITHANN Hans, MD, PhD. It was created on their behalf by Almetta Pesa, an ophthalmic technician. The creation of this record is the provider's dictation and/or activities during the visit.    Electronically signed by: Almetta Pesa, OA, 03/09/2024  2:21 AM   Redell JUDITHANN Hans, M.D., Ph.D. Diseases & Surgery of the Retina and Vitreous [facility] 03/02/2024  I have reviewed the above documentation for accuracy and completeness, and I agree with the above. Redell JUDITHANN Hans, M.D., Ph.D. 03/09/2024 2:21 AM   Abbreviations: M myopia (nearsighted); A astigmatism; H hyperopia (farsighted); P presbyopia; Mrx spectacle prescription;  CTL contact lenses; OD right eye; OS left eye; OU both eyes  XT exotropia; ET esotropia; PEK  punctate epithelial keratitis; PEE punctate epithelial erosions; DES dry eye syndrome; MGD meibomian gland dysfunction; ATs artificial tears; PFAT's preservative free artificial tears; NSC nuclear sclerotic cataract; PSC posterior subcapsular cataract; ERM epi-retinal membrane; PVD posterior vitreous detachment; RD retinal detachment; DM diabetes mellitus; DR diabetic retinopathy; NPDR non-proliferative diabetic retinopathy; PDR proliferative diabetic retinopathy; CSME clinically significant macular edema; DME diabetic macular edema; dbh dot blot hemorrhages; CWS cotton wool spot; POAG primary open angle glaucoma; C/D cup-to-disc ratio; HVF humphrey visual field; GVF goldmann visual field; OCT optical coherence tomography; IOP intraocular pressure; BRVO Branch retinal vein occlusion; CRVO central retinal vein occlusion; CRAO central retinal artery occlusion; BRAO branch retinal artery occlusion; RT retinal tear; SB scleral buckle; PPV pars plana vitrectomy; VH Vitreous hemorrhage; PRP panretinal laser photocoagulation; IVK intravitreal kenalog ; VMT vitreomacular traction; MH Macular hole;  NVD neovascularization of the disc; NVE neovascularization elsewhere; AREDS age related eye disease study; ARMD age related macular degeneration; POAG primary open angle glaucoma; EBMD epithelial/anterior basement membrane dystrophy; ACIOL anterior chamber intraocular lens; IOL intraocular lens; PCIOL posterior chamber intraocular lens; Phaco/IOL phacoemulsification with intraocular lens placement; PRK photorefractive keratectomy; LASIK laser assisted in situ keratomileusis; HTN hypertension; DM diabetes mellitus; COPD chronic obstructive pulmonary disease      [1] No Known Allergies [2]  Social History Tobacco Use   Smoking status: Former   Smokeless tobacco: Never  Substance Use Topics   Alcohol use: Yes    Alcohol/week: 2.0 standard drinks of alcohol    Types: 2  Standard drinks or equivalent per week    Comment:  just on weekend   Drug use: No   "

## 2024-03-02 ENCOUNTER — Encounter (INDEPENDENT_AMBULATORY_CARE_PROVIDER_SITE_OTHER): Payer: Self-pay | Admitting: Ophthalmology

## 2024-03-02 ENCOUNTER — Ambulatory Visit (INDEPENDENT_AMBULATORY_CARE_PROVIDER_SITE_OTHER): Admitting: Ophthalmology

## 2024-03-02 DIAGNOSIS — L718 Other rosacea: Secondary | ICD-10-CM | POA: Diagnosis not present

## 2024-03-02 DIAGNOSIS — H25813 Combined forms of age-related cataract, bilateral: Secondary | ICD-10-CM | POA: Diagnosis not present

## 2024-03-02 DIAGNOSIS — H35372 Puckering of macula, left eye: Secondary | ICD-10-CM

## 2024-03-08 ENCOUNTER — Encounter (INDEPENDENT_AMBULATORY_CARE_PROVIDER_SITE_OTHER): Payer: Self-pay | Admitting: Ophthalmology

## 2025-01-04 ENCOUNTER — Encounter: Admitting: Family Medicine
